# Patient Record
Sex: Male | Born: 1985 | Race: Black or African American | Hispanic: No | Marital: Single | Smoking: Never smoker
Health system: Southern US, Community
[De-identification: ages and names within clinical notes are randomized; demographics above are authoritative.]

---

## 2017-07-11 ENCOUNTER — Encounter (HOSPITAL_COMMUNITY): Payer: Self-pay | Admitting: Emergency Medicine

## 2017-07-11 ENCOUNTER — Ambulatory Visit (HOSPITAL_COMMUNITY)
Admission: RE | Admit: 2017-07-11 | Discharge: 2017-07-11 | Disposition: A | Discharge status: Home / Self Care | Payer: Commercial Managed Care - PPO | Source: Ambulatory Visit | Attending: Emergency Medicine | Admitting: Emergency Medicine

## 2017-07-11 ENCOUNTER — Other Ambulatory Visit: Payer: Self-pay

## 2017-07-11 ENCOUNTER — Ambulatory Visit (HOSPITAL_COMMUNITY)
Admission: EM | Admit: 2017-07-11 | Discharge: 2017-07-11 | Disposition: A | Discharge status: Home / Self Care | Payer: Self-pay | Attending: Internal Medicine | Admitting: Internal Medicine

## 2017-07-11 DIAGNOSIS — M79604 Pain in right leg: Secondary | ICD-10-CM | POA: Insufficient documentation

## 2017-07-11 DIAGNOSIS — M7989 Other specified soft tissue disorders: Secondary | ICD-10-CM

## 2017-07-11 DIAGNOSIS — M79609 Pain in unspecified limb: Secondary | ICD-10-CM

## 2017-07-11 DIAGNOSIS — L0390 Cellulitis, unspecified: Secondary | ICD-10-CM

## 2017-07-11 DIAGNOSIS — L03115 Cellulitis of right lower limb: Secondary | ICD-10-CM

## 2017-07-11 MED ORDER — CEPHALEXIN 500 MG PO CAPS: 500.0000 mg | ORAL_CAPSULE | Freq: Four times a day (QID) | ORAL | 0 refills | Status: AC

## 2017-07-11 NOTE — ED Notes (Signed)
US appointment at 4pm today. PT is to go to admissions desk at the front of the hospital

## 2017-07-11 NOTE — ED Provider Notes (Signed)
[facility]    CSN: [id number] Arrival date & time: 07/11/17  1319     History   Chief Complaint Chief Complaint  Patient presents with  . Leg Pain    HPI Daniel Charles is a 32 y.o. male.   Daniel Charles presents with complaints of right lower leg pain and swelling which has been present for approximately 1 week. Tender to touch. He states it is mildly improved currently. Denies any previous similar. He is a [employer] architecttruck driver. Pain is 3/10, pressure, worse when dependent. Left leg without swelling. Without cough, shortness of breath, or chest pain. Denies cardiac history, without fevers. Without blood clot history. Denies numbness or tingling to foot. Has not taken any medications for symptoms. Daniel Charles does vape.    ROS per HPI.       History reviewed. No pertinent past medical history.  There are no active problems to display for this patient.   History reviewed. No pertinent surgical history.     Home Medications    Prior to Admission medications   Medication Sig Start Date End Date Taking? Authorizing Provider  cephALEXin (KEFLEX) 500 MG capsule Take 1 capsule (500 mg total) by mouth 4 (four) times daily for 10 days. 07/11/17 07/21/17  Georgetta HaberBurky, Fotini Lemus B, NP    Family History No family history on file.  Social History Social History   Tobacco Use  . Smoking status: Never Smoker  Substance Use Topics  . Alcohol use: No    Frequency: Never  . Drug use: Not on file     Allergies   Patient has no known allergies.   Review of Systems Review of Systems   Physical Exam Triage Vital Signs ED Triage Vitals  Enc Vitals Group     BP 07/11/17 1403 (!) 172/91     Pulse Rate 07/11/17 1403 100     Resp 07/11/17 1403 18     Temp 07/11/17 1403 98.3 F (36.8 C)     Temp src --      SpO2 07/11/17 1403 97 %     Weight --      Height --      Head Circumference --      Peak Flow --      Pain Score 07/11/17 1404 3     Pain Loc --      Pain Edu? --    Excl. in GC? --    No data found.  Updated Vital Signs BP (!) 172/91   Pulse 100   Temp 98.3 F (36.8 C)   Resp 18   SpO2 97%   Visual Acuity Right Eye Distance:   Left Eye Distance:   Bilateral Distance:    Right Eye Near:   Left Eye Near:    Bilateral Near:     Physical Exam  Constitutional: He is oriented to person, place, and time. He appears well-developed and well-nourished.  Cardiovascular: Normal rate and regular rhythm.  Pulmonary/Chest: Effort normal and breath sounds normal.  Musculoskeletal:       Right lower leg: He exhibits tenderness, swelling and edema.       Legs: Redness, tenderness to distal right lower leg; sensation intact; pain primarily over anterior leg but redness noted to medial and posterior leg; strong radial pulse; complete ROM to ankle and toes; mildly warm to touch; without skin lesion   Neurological: He is alert and oriented to person, place, and time.  Skin: Skin is warm and dry.  UC Treatments / Results  Labs (all labs ordered are listed, but only abnormal results are displayed) Labs Reviewed - No data to display  EKG  EKG Interpretation None       Radiology No results found.  Procedures Procedures (including critical care time)  Medications Ordered in UC Medications - No data to display   Initial Impression / Assessment and Plan / UC Course  I have reviewed the triage vital signs and the nursing notes.  Pertinent labs & imaging results that were available during my care of the patient were reviewed by me and considered in my medical decision making (see chart for details).     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 Keflex started at this time presuming cellulitic in nature at this time. Will call patient if positive ultrasound- [phone]. Follow up for recheck in 3 days. Establish with a PCP for follow up and management as needed. Patient  verbalized understanding and agreeable to plan.  Return precautions provided.   Final Clinical Impressions(s) / UC Diagnoses   Final diagnoses:  Leg swelling  Cellulitis of right lower extremity    ED Discharge Orders        Ordered    cephALEXin (KEFLEX) 500 MG capsule  4 times daily     07/11/17 1501    VAS [place] LOWER EXTREMITY VENOUS (DVT)     07/11/17 1502        Controlled Substance Prescriptions [place] Controlled Substance Registry consulted? Not Applicable   Georgetta Haber, NP 07/11/17 1511

## 2017-07-11 NOTE — Progress Notes (Addendum)
Preliminary results by tech - Right Lower Ext. Venous Duplex Completed. Negative for deep vein thrombosis. [facility]. Marilynne Halstedita Aracelie Addis, BS, RDMS, RVT

## 2017-07-11 NOTE — ED Triage Notes (Signed)
Pt c/o R leg pain and swelling x1 week, denies injury. Pt is a Naval architecttruck driver.

## 2017-07-11 NOTE — Discharge Instructions (Signed)
We will rule out blood clot today with an ultrasound.  As long as this is negative we will treat this as cellulitis, or infection. Complete course of antibiotics. Elevate the leg for comfort, warm compresses may be helpful. Recheck in 3 days. Return sooner if worsening of pain, fevers, numbness or tingling or otherwise worsening of symptoms.  I will call you if any positive findings from ultrasound today.  Please establish with a primary care provider for follow up. Use of compression stockings to help prevent swelling, especially while working.

## 2017-07-13 ENCOUNTER — Ambulatory Visit (HOSPITAL_COMMUNITY)
Admission: EM | Admit: 2017-07-13 | Discharge: 2017-07-13 | Disposition: A | Discharge status: Home / Self Care | Payer: Commercial Managed Care - PPO | Attending: Family Medicine | Admitting: Family Medicine

## 2017-07-13 ENCOUNTER — Encounter (HOSPITAL_COMMUNITY): Payer: Self-pay | Admitting: Emergency Medicine

## 2017-07-13 ENCOUNTER — Other Ambulatory Visit: Payer: Self-pay

## 2017-07-13 DIAGNOSIS — M7989 Other specified soft tissue disorders: Secondary | ICD-10-CM | POA: Diagnosis not present

## 2017-07-13 DIAGNOSIS — Z09 Encounter for follow-up examination after completed treatment for conditions other than malignant neoplasm: Secondary | ICD-10-CM | POA: Diagnosis not present

## 2017-07-13 NOTE — ED Triage Notes (Signed)
Pt is here for recheck of his RLE.  Pt states that it is getting better.    Pt states he needs a note for work stating if this will happen again, why did it happen, and how can he prevent it.

## 2017-07-13 NOTE — ED Provider Notes (Signed)
[facility]    CSN: [id number] Arrival date & time: 07/13/17  1742     History   Chief Complaint Chief Complaint  Patient presents with  . Follow-up    HPI Daniel Charles is a 32 y.o. male presenting for follow up of right lower extremity soreness and swelling. He was seen here 2 days ago for this complaint. DVT ultrasound was performed and ruled out. Treated with keflex for possible cellulitis. Has taken Keflex for 1.5 days, feels area has improved, still slightly sore and tender. Elevating legs. Denies shortness of breath, chest pain. 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 Pain does not worsen with walking.  HPI  No past medical history on file.  There are no active problems to display for this patient.   No past surgical history on file.     Home Medications    Prior to Admission medications   Medication Sig Start Date End Date Taking? Authorizing Provider  cephALEXin (KEFLEX) 500 MG capsule Take 1 capsule (500 mg total) by mouth 4 (four) times daily for 10 days. 07/11/17 07/21/17  Georgetta HaberBurky, Natalie B, NP  cephALEXin (KEFLEX) 500 MG capsule Take 500 mg by mouth 4 (four) times daily. 07/11/17 07/21/17  [provider]    Family History No family history on file.  Social History Social History   Tobacco Use  . Smoking status: Never Smoker  Substance Use Topics  . Alcohol use: No    Frequency: Never  . Drug use: Not on file     Allergies   Patient has no known allergies.   Review of Systems Review of Systems  Constitutional: Negative for fever.  Respiratory: Negative for shortness of breath.   Cardiovascular: Negative for chest pain.  Gastrointestinal: Negative for abdominal pain, nausea and vomiting.  Musculoskeletal: Positive for myalgias. Negative for gait problem and joint swelling.       Leg swelling, leg pain  Skin: Positive for color change.  Neurological: Negative for dizziness,  light-headedness and headaches.     Physical Exam Triage Vital Signs ED Triage Vitals  Enc Vitals Group     BP 07/13/17 1817 (!) 155/104     Pulse Rate 07/13/17 1817 85     Resp --      Temp 07/13/17 1817 98.4 F (36.9 C)     Temp Source 07/13/17 1817 Oral     SpO2 07/13/17 1817 95 %     Weight --      Height --      Head Circumference --      Peak Flow --      Pain Score 07/13/17 1816 2     Pain Loc --      Pain Edu? --      Excl. in GC? --    No data found.  Updated Vital Signs BP (!) 155/104 (BP Location: Right Wrist)   Pulse 85   Temp 98.4 F (36.9 C) (Oral)   SpO2 95%    Physical Exam  Constitutional: He appears well-developed and well-nourished.  HENT:  Head: Normocephalic and atraumatic.  Eyes: Conjunctivae are normal.  Neck: Neck supple.  Cardiovascular: Normal rate and regular rhythm.  No murmur heard. Pulmonary/Chest: Effort normal and breath sounds normal. No respiratory distress. He has no wheezes.  Abdominal: He exhibits no distension.  Musculoskeletal: He exhibits edema.  Neurological: He is alert.  Skin: Skin is warm and dry.  Hyperpigmentation with minimal  erythema to right lower shin wrapping around to posterior leg. Skin changes not present on left lower leg. 1+ pitting edema on right, no edema on left. Mild tenderness to palpation on anterior right leg, no tenderness to calf or posterior.  Area appears to be slightly less hyperpigmented compared to 2 days ago. (compared to picture on mother's phone)  Psychiatric: He has a normal mood and affect.  Nursing note and vitals reviewed.    UC Treatments / Results  Labs (all labs ordered are listed, but only abnormal results are displayed) Labs Reviewed - No data to display  EKG  EKG Interpretation None       Radiology No results found.  Procedures Procedures (including critical care time)  Medications Ordered in UC Medications - No data to display   Initial Impression /  Assessment and Plan / UC Course  I have reviewed the triage vital signs and the nursing notes.  Pertinent labs & imaging results that were available during my care of the patient were reviewed by me and considered in my medical decision making (see chart for details).     DVT rule out [place] negative on Monday. Symptoms appear to be improving slightly after 1.5 days of treatment. Will continue Keflex. Advised compression stockings, elevation, walking frequently after sitting for extended periods of time. Discussed strict return precautions. Patient verbalized understanding and is agreeable with plan.   Final Clinical Impressions(s) / UC Diagnoses   Final diagnoses:  Right leg swelling  Follow up    ED Discharge Orders    None       Controlled Substance Prescriptions [place] Controlled Substance Registry consulted? Not Applicable   Lew Dawes, [place] 07/13/17 1859

## 2017-07-13 NOTE — Discharge Instructions (Signed)
Please continue coarse of Keflex and finish the entire 10 day course.   Please try compression stockings to help with circulation and swelling. Elevate legs at home when possible to help with swelling. When driving in car long distances, please may frequent stops to walk around and move legs.   Please continue to monitor for improvement as you continue to antibiotic course. Please return if swelling increases, redness increases, pain increases, develop shortness of breath, cough, difficulty breathing, lightheaded or dizziness.

## 2017-12-30 ENCOUNTER — Other Ambulatory Visit: Payer: Self-pay

## 2017-12-30 ENCOUNTER — Encounter (HOSPITAL_COMMUNITY): Payer: Self-pay | Admitting: Emergency Medicine

## 2017-12-30 ENCOUNTER — Ambulatory Visit (HOSPITAL_COMMUNITY)
Admission: EM | Admit: 2017-12-30 | Discharge: 2017-12-30 | Disposition: A | Discharge status: Home / Self Care | Payer: Self-pay | Attending: Family Medicine | Admitting: Family Medicine

## 2017-12-30 DIAGNOSIS — M5489 Other dorsalgia: Secondary | ICD-10-CM

## 2017-12-30 DIAGNOSIS — W19XXXA Unspecified fall, initial encounter: Secondary | ICD-10-CM

## 2017-12-30 DIAGNOSIS — M62830 Muscle spasm of back: Secondary | ICD-10-CM

## 2017-12-30 DIAGNOSIS — T148XXA Other injury of unspecified body region, initial encounter: Secondary | ICD-10-CM

## 2017-12-30 MED ORDER — KETOROLAC TROMETHAMINE 60 MG/2ML IM SOLN
60.0000 mg | Freq: Once | INTRAMUSCULAR | Status: AC
  Administered 2017-12-30: 60 mg via INTRAMUSCULAR

## 2017-12-30 MED ORDER — IBUPROFEN 800 MG PO TABS: 800.0000 mg | ORAL_TABLET | Freq: Three times a day (TID) | ORAL | 0 refills | Status: DC | PRN

## 2017-12-30 MED ORDER — METHYLPREDNISOLONE SODIUM SUCC 125 MG IJ SOLR
80.0000 mg | Freq: Once | INTRAMUSCULAR | Status: AC
  Administered 2017-12-30: 80 mg via INTRAMUSCULAR

## 2017-12-30 MED ORDER — KETOROLAC TROMETHAMINE 60 MG/2ML IM SOLN
INTRAMUSCULAR | Status: AC
  Filled 2017-12-30: qty 2

## 2017-12-30 MED ORDER — METHYLPREDNISOLONE SODIUM SUCC 125 MG IJ SOLR
INTRAMUSCULAR | Status: AC
  Filled 2017-12-30: qty 2

## 2017-12-30 MED ORDER — CYCLOBENZAPRINE HCL 5 MG PO TABS: 5.0000 mg | ORAL_TABLET | Freq: Three times a day (TID) | ORAL | 0 refills | Status: DC | PRN

## 2017-12-30 NOTE — Discharge Instructions (Signed)
Activity as tolerated Walking and gentle stretching is advisable Take ibuprofen 3 times a day with food Take the Flexeril as needed as a muscle relaxer Take a Flexeril before you go to bed tonight Return if you are not improving over the next week or 2

## 2017-12-30 NOTE — ED Triage Notes (Signed)
[facility] a complaint of left side flank and back pain that started after sleeping on a leaking air mattress 2 days ago.

## 2017-12-30 NOTE — ED Provider Notes (Signed)
[facility]    CSN: [id number] Arrival date & time: 12/30/17  1648     History   Chief Complaint Chief Complaint  Patient presents with  . Back Pain    HPI Daniel Charles is a 32 y.o. male.   HPI  Healthy 32 year old who is here with his mother for evaluation of back pain.  She drove because he is in too much pain to move comfortably.  This started day before yesterday, he slipped on air mattress that deflated over the night.  He woke up on the floor when he tried to move his left back was quite painful.  He stayed quite throughout the day.  Took some ibuprofen.  Today the back pain has not improved so he is here for evaluation. No history of significant back problem in the past. No bowel or bladder complaint.  No incontinence.  No urinary infection, hematuria, dysuria. The pain is in the mid left back and the flank area.  No radiation.  No numbness or weakness into the arms or legs.  He does say the pain increases with weight on the left leg.  Better with rest. He cannot tell that the over-the-counter medicine gave him significant pain relief. 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 History reviewed. No pertinent past medical history. Patient states that he has no ongoing medical problems. No prescription medications. There are no active problems to display for this patient.   History reviewed. No pertinent surgical history.     Home Medications    Prior to Admission medications   Medication Sig Start Date End Date Taking? Authorizing Provider  cyclobenzaprine (FLEXERIL) 5 MG tablet Take 1 tablet (5 mg total) by mouth 3 (three) times daily as needed for muscle spasms. 12/30/17   Eustace MooreNelson, Vimal Derego Sue, MD  ibuprofen (ADVIL,MOTRIN) 800 MG tablet Take 1 tablet (800 mg total) by mouth every 8 (eight) hours as needed for moderate pain. 12/30/17   Eustace MooreNelson, Nevae Pinnix Sue, MD    Family History History reviewed. No pertinent family history. Mother is  here.  Recovering from breast cancer.  Denies orthopedic complaints. Social History Social History   Tobacco Use  . Smoking status: Never Smoker  . Smokeless tobacco: Never Used  Substance Use Topics  . Alcohol use: No    Frequency: Never  . Drug use: Never     Allergies   Patient has no known allergies.   Review of Systems Review of Systems  Constitutional: Negative for chills and fever.  HENT: Negative for ear pain and sore throat.   Eyes: Negative for pain and visual disturbance.  Respiratory: Negative for cough and shortness of breath.   Cardiovascular: Negative for chest pain and palpitations.  Gastrointestinal: Negative for abdominal pain and vomiting.  Genitourinary: Negative for dysuria and hematuria.  Musculoskeletal: Positive for back pain. Negative for arthralgias.  Skin: Negative for color change and rash.  Neurological: Negative for seizures and syncope.  All other systems reviewed and are negative.    Physical Exam Triage Vital Signs ED Triage Vitals  Enc Vitals Group     BP 12/30/17 1747 (!) 159/93     Pulse Rate 12/30/17 1747 (!) 112     Resp 12/30/17 1747 20     Temp 12/30/17 1747 98.7 F (37.1 C)     Temp Source 12/30/17 1747 Oral     SpO2 12/30/17 1747 98 %     Weight --      Height --  Head Circumference --      Peak Flow --      Pain Score 12/30/17 1746 6     Pain Loc --      Pain Edu? --      Excl. in GC? --    No data found.  Updated Vital Signs BP (!) 159/93 (BP Location: Right Arm)   Pulse (!) 112   Temp 98.7 F (37.1 C) (Oral)   Resp 20   SpO2 98%       Physical Exam  Constitutional: He appears well-developed and well-nourished. He appears distressed.  Morbidly obese.  Appears acutely uncomfortable.  Very guarded movements.  HENT:  Head: Normocephalic and atraumatic.  Mouth/Throat: Oropharynx is clear and moist.  Eyes: Pupils are equal, round, and reactive to light. Conjunctivae are normal.  Neck: Normal range of  motion.  Cardiovascular: Normal rate, regular rhythm and normal heart sounds.  Pulmonary/Chest: Effort normal and breath sounds normal. No respiratory distress. He has no wheezes.  Abdominal: Soft. Bowel sounds are normal. He exhibits no distension.  Musculoskeletal: Normal range of motion. He exhibits no edema.  Patient has very limited range of motion secondary to pain.  He tends to lean towards the right.  No tenderness in the thoracic spine or ribs.  No tenderness in the lumbar spine.  No tenderness in the lumbar paraspinous muscles.  No tenderness over the SI joints or posterior pelvis.  Strength sensation range of motion and reflexes are normal in both lower extremities.  Neurological: He is alert.  Skin: Skin is warm and dry.  Psychiatric: He has a normal mood and affect. His behavior is normal.     UC Treatments / Results  Labs (all labs ordered are listed, but only abnormal results are displayed) Labs Reviewed - No data to display  EKG None  Radiology No results found.  Procedures Procedures (including critical care time)  Medications Ordered in UC Medications  ketorolac (TORADOL) injection 60 mg (60 mg Intramuscular Given 12/30/17 1857)  methylPREDNISolone sodium succinate (SOLU-MEDROL) 125 mg/2 mL injection 80 mg (80 mg Intramuscular Given 12/30/17 1857)    Initial Impression / Assessment and Plan / UC Course  I have reviewed the triage vital signs and the nursing notes.  Pertinent labs & imaging results that were available during my care of the patient were reviewed by me and considered in my medical decision making (see chart for details).     Patient was given an injection with Toradol.  Another injection of methylprednisolone.  After 30 to 40 minutes I rechecked him.  He was much improved.  Moving more comfortably.  Reexamined.  Straight leg raise is still negative bilaterally.  Lumbar mobility is slow and about 50% of normal.  Still no tenderness to  palpation. Final Clinical Impressions(s) / UC Diagnoses   Final diagnoses:  Muscle strain     Discharge Instructions     Activity as tolerated Walking and gentle stretching is advisable Take ibuprofen 3 times a day with food Take the Flexeril as needed as a muscle relaxer Take a Flexeril before you go to bed tonight Return if you are not improving over the next week or 2   ED Prescriptions    Medication Sig Dispense Auth. Provider   ibuprofen (ADVIL,MOTRIN) 800 MG tablet Take 1 tablet (800 mg total) by mouth every 8 (eight) hours as needed for moderate pain. 90 tablet Eustace Moore, MD   cyclobenzaprine (FLEXERIL) 5 MG tablet Take 1 tablet (5  mg total) by mouth 3 (three) times daily as needed for muscle spasms. 30 tablet Eustace Moore, MD     Controlled Substance Prescriptions [place] Controlled Substance Registry consulted? Not Applicable   Eustace Moore, MD 12/30/17 2203

## 2018-09-13 ENCOUNTER — Other Ambulatory Visit: Payer: Self-pay

## 2018-09-13 ENCOUNTER — Encounter (HOSPITAL_COMMUNITY): Payer: Self-pay | Admitting: Emergency Medicine

## 2018-09-13 ENCOUNTER — Emergency Department (HOSPITAL_COMMUNITY)
Admission: EM | Admit: 2018-09-13 | Discharge: 2018-09-13 | Disposition: A | Discharge status: Home / Self Care | Payer: Commercial Managed Care - PPO | Attending: Emergency Medicine | Admitting: Emergency Medicine

## 2018-09-13 DIAGNOSIS — Y929 Unspecified place or not applicable: Secondary | ICD-10-CM | POA: Diagnosis not present

## 2018-09-13 DIAGNOSIS — M545 Low back pain, unspecified: Secondary | ICD-10-CM

## 2018-09-13 DIAGNOSIS — Y33XXXA Other specified events, undetermined intent, initial encounter: Secondary | ICD-10-CM | POA: Insufficient documentation

## 2018-09-13 DIAGNOSIS — Y998 Other external cause status: Secondary | ICD-10-CM | POA: Insufficient documentation

## 2018-09-13 DIAGNOSIS — Y9389 Activity, other specified: Secondary | ICD-10-CM | POA: Insufficient documentation

## 2018-09-13 DIAGNOSIS — S39012A Strain of muscle, fascia and tendon of lower back, initial encounter: Secondary | ICD-10-CM | POA: Diagnosis not present

## 2018-09-13 DIAGNOSIS — S3992XA Unspecified injury of lower back, initial encounter: Secondary | ICD-10-CM | POA: Diagnosis present

## 2018-09-13 MED ORDER — LIDOCAINE 5 % EX PTCH
1.0000 | MEDICATED_PATCH | CUTANEOUS | Status: DC
  Administered 2018-09-13: 1 via TRANSDERMAL
  Filled 2018-09-13: qty 1

## 2018-09-13 MED ORDER — METHOCARBAMOL 500 MG PO TABS: 500.0000 mg | ORAL_TABLET | Freq: Three times a day (TID) | ORAL | 0 refills | Status: DC

## 2018-09-13 MED ORDER — KETOROLAC TROMETHAMINE 15 MG/ML IJ SOLN
15.0000 mg | Freq: Once | INTRAMUSCULAR | Status: AC
  Administered 2018-09-13: 15 mg via INTRAMUSCULAR
  Filled 2018-09-13: qty 1

## 2018-09-13 NOTE — ED Notes (Signed)
Patient verbalizes understanding of discharge instructions. Opportunity for questioning and answers were provided. Armband removed by staff, pt discharged from ED ambulatory to home.  

## 2018-09-13 NOTE — ED Triage Notes (Signed)
Pt complains of right sided back and side pain. Pt thinks he has a pinched nerve

## 2018-09-13 NOTE — Discharge Instructions (Addendum)
You have been diagnosed today with right-sided lower back pain.  At this time there does not appear to be the presence of an emergent medical condition, however there is always the potential for conditions to change. Please read and follow the below instructions.  Please return to the Emergency Department immediately for any new or worsening symptoms. Please be sure to follow up with your Primary Care Provider within one week regarding your visit today; please call their office to schedule an appointment even if you are feeling better for a follow-up visit.  If you do not have a primary care provider you may contact [place] [facility] to establish one. You have been given an anti-inflammatory shot today called Toradol.  Please avoid further NSAIDs including ibuprofen/Motrin over the next 2 days.  Please drink plenty of water. You may use the the muscle relaxer Robaxin as prescribed to help with your symptoms.  Do not drive or operate machinery while taking a muscle relaxers as they may make you sleepy.  Get help right away if: You develop new bowel or bladder control problems. You have unusual weakness or numbness in your arms or legs. You develop nausea or vomiting. You develop abdominal pain. You feel faint. You have fever or chills Any new or concerning symptoms  Please read the additional information packets attached to your discharge summary.  Do not take your medicine if  develop an itchy rash, swelling in your mouth or lips, or difficulty breathing.

## 2018-09-13 NOTE — ED Provider Notes (Signed)
[name] EMERGENCY DEPARTMENT Provider Note   CSN: [id number] Arrival date & time: 09/13/18  1605    History   Chief Complaint Chief Complaint  Patient presents with  . Back Pain    HPI Daniel Charles is a 33 y.o. male presenting today for right-sided lower back pain.  Reports a 3-day history of right-sided lower back pain, sharp throbbing constant worsened with movement and improved with rest and moderate in intensity.  Patient believes that his pain started after sleeping on an uncomfortable mattress 3 nights ago.  Patient reports that he has had similar pain in the past after again sleeping on an air mattress, he states that he was seen at an urgent care in July of last year at that time he was provided with a Toradol shot as well as steroid shot which alleviated his pain.  Patient reports that his pain today is similar in nature to July of last year however today's pain is less severe.  Patient denies fever/chills, history of IV drug use, history of cancer, saddle area paresthesias, bowel/bladder incontinence, urinary retention, numbness/weakness or tingling of the extremities or any additional concerns today.    HPI  History reviewed. No pertinent past medical history.  There are no active problems to display for this patient.   History reviewed. No pertinent surgical history.      Home Medications    Prior to Admission medications   Medication Sig Start Date End Date Taking? Authorizing Provider  naproxen sodium (ALEVE) 220 MG tablet Take 440 mg by mouth as needed (pain).   Yes [provider]  methocarbamol (ROBAXIN) 500 MG tablet Take 1 tablet (500 mg total) by mouth 3 (three) times daily. 09/13/18   Bill Salinas, PA-C    Family History No family history on file.  Social History Social History   Tobacco Use  . Smoking status: Never Smoker  . Smokeless tobacco: Never Used  Substance Use Topics  . Alcohol use: No   Frequency: Never  . Drug use: Never     Allergies   Patient has no known allergies.   Review of Systems Review of Systems  Constitutional: Negative.  Negative for chills and fever.  Cardiovascular: Negative.  Negative for chest pain.  Gastrointestinal: Negative.  Negative for abdominal pain, nausea and vomiting.  Genitourinary: Negative.  Negative for dysuria, flank pain, hematuria, scrotal swelling and testicular pain.  Musculoskeletal: Positive for back pain. Negative for neck pain.  Neurological: Negative for weakness, numbness and headaches.       Denies saddle area paresthesias Denies bowel or bladder incontinence Denies urinary retention   Physical Exam Updated Vital Signs BP (!) 163/92 (BP Location: Right Arm)   Pulse (!) 108   Temp 98.3 F (36.8 C) (Oral)   Resp 16   Ht 6\' 2"  (1.88 m)   Wt (!) 185.5 kg   SpO2 97%   BMI 52.51 kg/m   Physical Exam Constitutional:      General: He is not in acute distress.    Appearance: Normal appearance. He is obese. He is not ill-appearing or diaphoretic.  HENT:     Head: Normocephalic and atraumatic. No raccoon eyes or Battle's sign.     Jaw: There is normal jaw occlusion. No trismus.     Right Ear: Tympanic membrane, ear canal and external ear normal. No hemotympanum.     Left Ear: Tympanic membrane, ear canal and external ear normal. No hemotympanum.     Nose:  Nose normal.     Mouth/Throat:     Lips: Pink.     Mouth: Mucous membranes are moist.     Pharynx: Oropharynx is clear. Uvula midline.  Eyes:     General: Vision grossly intact. Gaze aligned appropriately.     Extraocular Movements: Extraocular movements intact.     Conjunctiva/sclera: Conjunctivae normal.     Pupils: Pupils are equal, round, and reactive to light.  Neck:     Musculoskeletal: Normal range of motion and neck supple. No neck rigidity.     Trachea: Trachea and phonation normal. No tracheal tenderness or tracheal deviation.  Cardiovascular:      Rate and Rhythm: Normal rate and regular rhythm.     Pulses:          Radial pulses are 2+ on the right side and 2+ on the left side.       Dorsalis pedis pulses are 2+ on the right side and 2+ on the left side.       Posterior tibial pulses are 2+ on the right side and 2+ on the left side.     Heart sounds: Normal heart sounds.  Pulmonary:     Effort: Pulmonary effort is normal. No respiratory distress.     Breath sounds: Normal breath sounds and air entry. No decreased breath sounds or rhonchi.  Abdominal:     General: Bowel sounds are normal. There is no distension.     Palpations: Abdomen is soft. There is no pulsatile mass.     Tenderness: There is no abdominal tenderness. There is no right CVA tenderness, left CVA tenderness, guarding or rebound.  Musculoskeletal:       Back:     Comments: No midline C/T/L spinal tenderness to palpation no deformity, crepitus, or step-off noted. No sign of injury to the neck or back.  Tenderness and pain with movement of the right lower back as indicated.   Feet:     Right foot:     Protective Sensation: 3 sites tested. 3 sites sensed.     Left foot:     Protective Sensation: 3 sites tested. 3 sites sensed.  Skin:    General: Skin is warm and dry.     Capillary Refill: Capillary refill takes less than 2 seconds.  Neurological:     Mental Status: He is alert.     GCS: GCS eye subscore is 4. GCS verbal subscore is 5. GCS motor subscore is 6.     Comments: Speech is clear and goal oriented, follows commands Major Cranial nerves without deficit, no facial droop Normal strength in upper and lower extremities bilaterally including dorsiflexion and plantar flexion, strong and equal grip strength Sensation normal to light touch DTR 2+ bilateral patella, no clonus of the feet Moves extremities without ataxia, coordination intact Normal gait  Psychiatric:        Behavior: Behavior is cooperative.      ED Treatments / Results  Labs (all labs  ordered are listed, but only abnormal results are displayed) Labs Reviewed - No data to display  EKG None  Radiology No results found.  Procedures Procedures (including critical care time)  Medications Ordered in ED Medications  lidocaine (LIDODERM) 5 % 1 patch (1 patch Transdermal Patch Applied 09/13/18 1749)  ketorolac (TORADOL) 15 MG/ML injection 15 mg (15 mg Intramuscular Given 09/13/18 1749)     Initial Impression / Assessment and Plan / ED Course  I have reviewed the triage vital signs and  the nursing notes.  Pertinent labs & imaging results that were available during my care of the patient were reviewed by me and considered in my medical decision making (see chart for details).    Derico Mitton is a 33 y.o. male presenting with right-sided lower back pain.  He describes this as his typical back pain however less severe than previous presentations. Patient denies history of trauma, fever, IV drug use, night sweats, weight loss, cancer, saddle anesthesia, urinary rentention, bowel/bladder incontinence. No neurological deficits and normal neuro exam.  He denies dysuria/hematuria and denies personal/family history of kidney stones.  Suspect musculoskeletal etiology of patient's pain. Pain is consistently reproducible with movement and palpation right lower back musculature. Abdomen soft/nontender and without pulsatile mass. Patient with equal pedal pulses. Doubt spinal epidural abscess, nephrolithiasis, cauda equina or AAA.  Imaging not indicated at this time.  Patient is ambulatory in the emergency department without assistance. RICE protocol and pain medicine indicated and discussed with patient.   Toradol 15 mg IM given.  Patient denies history of CKD or gastric ulcers/bleeding. Robaxin  TID prescribed. Patient informed to avoid driving or operating heavy machinery while taking muscle relaxer.  Additionally patient informed that he was hypertensive today.   Encouraged to follow-up with primary care provider regarding this.  He is asymptomatic regarding his hypertension.  Patient informed of signs and symptoms of hypertensive emergency/urgency and to return to the emergency department if these occur.  At this time there does not appear to be any evidence of an acute emergency medical condition and the patient appears stable for discharge with appropriate outpatient follow up. Diagnosis was discussed with patient who verbalizes understanding of care plan and is agreeable to discharge. I have discussed return precautions with patient who verbalize understanding of return precautions. Patient encouraged to follow-up with their PCP. All questions answered.  Patient has been discharged in good condition.   Note: Portions of this report may have been transcribed using voice recognition software. Every effort was made to ensure accuracy; however, inadvertent computerized transcription errors may still be present. Final Clinical Impressions(s) / ED Diagnoses   Final diagnoses:  Acute right-sided low back pain without sciatica  Strain of lumbar region, initial encounter    ED Discharge Orders         Ordered    methocarbamol (ROBAXIN) 500 MG tablet  3 times daily     09/13/18 1825           Elizabeth Palau 09/14/18 2127    Terrilee Files, MD 09/15/18 [phone]

## 2020-07-28 ENCOUNTER — Emergency Department (HOSPITAL_COMMUNITY): Payer: Commercial Managed Care - PPO

## 2020-07-28 ENCOUNTER — Inpatient Hospital Stay (HOSPITAL_COMMUNITY)
Admission: EM | Admit: 2020-07-28 | Discharge: 2020-08-01 | DRG: 177 | Disposition: A | Discharge status: Home Health | Payer: Commercial Managed Care - PPO | Attending: Internal Medicine | Admitting: Internal Medicine

## 2020-07-28 ENCOUNTER — Encounter (HOSPITAL_COMMUNITY): Payer: Self-pay | Admitting: Emergency Medicine

## 2020-07-28 ENCOUNTER — Other Ambulatory Visit: Payer: Self-pay

## 2020-07-28 ENCOUNTER — Emergency Department (HOSPITAL_BASED_OUTPATIENT_CLINIC_OR_DEPARTMENT_OTHER): Payer: Commercial Managed Care - PPO

## 2020-07-28 DIAGNOSIS — Z713 Dietary counseling and surveillance: Secondary | ICD-10-CM

## 2020-07-28 DIAGNOSIS — I2694 Multiple subsegmental pulmonary emboli without acute cor pulmonale: Secondary | ICD-10-CM | POA: Diagnosis present

## 2020-07-28 DIAGNOSIS — M7989 Other specified soft tissue disorders: Secondary | ICD-10-CM

## 2020-07-28 DIAGNOSIS — Z79899 Other long term (current) drug therapy: Secondary | ICD-10-CM

## 2020-07-28 DIAGNOSIS — J9601 Acute respiratory failure with hypoxia: Secondary | ICD-10-CM | POA: Diagnosis present

## 2020-07-28 DIAGNOSIS — M79604 Pain in right leg: Secondary | ICD-10-CM | POA: Diagnosis present

## 2020-07-28 DIAGNOSIS — U071 COVID-19: Secondary | ICD-10-CM | POA: Diagnosis not present

## 2020-07-28 DIAGNOSIS — I2699 Other pulmonary embolism without acute cor pulmonale: Secondary | ICD-10-CM | POA: Diagnosis not present

## 2020-07-28 DIAGNOSIS — R6510 Systemic inflammatory response syndrome (SIRS) of non-infectious origin without acute organ dysfunction: Secondary | ICD-10-CM | POA: Diagnosis present

## 2020-07-28 DIAGNOSIS — R609 Edema, unspecified: Secondary | ICD-10-CM | POA: Diagnosis not present

## 2020-07-28 DIAGNOSIS — Z6841 Body Mass Index (BMI) 40.0 and over, adult: Secondary | ICD-10-CM

## 2020-07-28 DIAGNOSIS — D6859 Other primary thrombophilia: Secondary | ICD-10-CM | POA: Diagnosis present

## 2020-07-28 DIAGNOSIS — J1282 Pneumonia due to coronavirus disease 2019: Secondary | ICD-10-CM | POA: Diagnosis present

## 2020-07-28 LAB — URINALYSIS, ROUTINE W REFLEX MICROSCOPIC
Glucose, UA: NEGATIVE mg/dL
Hgb urine dipstick: NEGATIVE
Ketones, ur: 5 mg/dL — AB
Nitrite: NEGATIVE
Protein, ur: 30 mg/dL — AB
Specific Gravity, Urine: 1.038 — ABNORMAL HIGH (ref 1.005–1.030)
pH: 6 (ref 5.0–8.0)

## 2020-07-28 LAB — TROPONIN I (HIGH SENSITIVITY)
Troponin I (High Sensitivity): 43 ng/L — ABNORMAL HIGH (ref <18)
Troponin I (High Sensitivity): 8 ng/L (ref <18)

## 2020-07-28 LAB — BASIC METABOLIC PANEL
Anion gap: 11 (ref 5–15)
BUN: 7 mg/dL (ref 6–20)
CO2: 25 mmol/L (ref 22–32)
Calcium: 9.1 mg/dL (ref 8.9–10.3)
Chloride: 102 mmol/L (ref 98–111)
Creatinine, Ser: 1.22 mg/dL (ref 0.61–1.24)
GFR, Estimated: >60 mL/min (ref >60)
Glucose, Bld: 116 mg/dL — ABNORMAL HIGH (ref 70–99)
Potassium: 4.1 mmol/L (ref 3.5–5.1)
Sodium: 138 mmol/L (ref 135–145)

## 2020-07-28 LAB — CBC
HCT: 45.7 % (ref 39.0–52.0)
Hemoglobin: 14 g/dL (ref 13.0–17.0)
MCH: 21.9 pg — ABNORMAL LOW (ref 26.0–34.0)
MCHC: 30.6 g/dL (ref 30.0–36.0)
MCV: 71.6 fL — ABNORMAL LOW (ref 80.0–100.0)
Platelets: 301 10*3/uL (ref 150–400)
RBC: 6.38 MIL/uL — ABNORMAL HIGH (ref 4.22–5.81)
RDW: 15.8 % — ABNORMAL HIGH (ref 11.5–15.5)
WBC: 18.3 10*3/uL — ABNORMAL HIGH (ref 4.0–10.5)
nRBC: 0 % (ref 0.0–0.2)

## 2020-07-28 LAB — SARS CORONAVIRUS 2 BY RT PCR (HOSPITAL ORDER, PERFORMED IN ~~LOC~~ HOSPITAL LAB): SARS Coronavirus 2: POSITIVE — AB

## 2020-07-28 LAB — HIV ANTIBODY (ROUTINE TESTING W REFLEX): HIV Screen 4th Generation wRfx: NONREACTIVE

## 2020-07-28 MED ORDER — HEPARIN BOLUS VIA INFUSION
7000.0000 [IU] | Freq: Once | INTRAVENOUS | Status: AC
  Administered 2020-07-28: 7000 [IU] via INTRAVENOUS
  Filled 2020-07-28: qty 7000

## 2020-07-28 MED ORDER — MORPHINE SULFATE (PF) 4 MG/ML IV SOLN
4.0000 mg | Freq: Once | INTRAVENOUS | Status: AC
  Administered 2020-07-28: 4 mg via INTRAVENOUS
  Filled 2020-07-28: qty 1

## 2020-07-28 MED ORDER — HEPARIN (PORCINE) 25000 UT/250ML-% IV SOLN
2000.0000 [IU]/h | INTRAVENOUS | Status: DC
  Administered 2020-07-28 – 2020-07-29 (×3): 2000 [IU]/h via INTRAVENOUS
  Filled 2020-07-28 (×3): qty 250

## 2020-07-28 MED ORDER — POLYETHYLENE GLYCOL 3350 17 G PO PACK: 17.0000 g | PACK | Freq: Every day | ORAL | Status: DC | PRN

## 2020-07-28 MED ORDER — SODIUM CHLORIDE 0.9% FLUSH
3.0000 mL | Freq: Two times a day (BID) | INTRAVENOUS | Status: DC
  Administered 2020-07-29 – 2020-08-01 (×7): 3 mL via INTRAVENOUS

## 2020-07-28 MED ORDER — ACETAMINOPHEN 650 MG RE SUPP: 650.0000 mg | Freq: Four times a day (QID) | RECTAL | Status: DC | PRN

## 2020-07-28 MED ORDER — LACTATED RINGERS IV BOLUS
1000.0000 mL | Freq: Once | INTRAVENOUS | Status: AC
  Administered 2020-07-28: 1000 mL via INTRAVENOUS

## 2020-07-28 MED ORDER — ACETAMINOPHEN 325 MG PO TABS
650.0000 mg | ORAL_TABLET | Freq: Four times a day (QID) | ORAL | Status: DC | PRN
  Administered 2020-07-29 (×2): 650 mg via ORAL
  Filled 2020-07-28 (×3): qty 2

## 2020-07-28 MED ORDER — OXYCODONE-ACETAMINOPHEN 5-325 MG PO TABS
1.0000 | ORAL_TABLET | ORAL | Status: DC | PRN
  Administered 2020-07-29 (×2): 1 via ORAL
  Filled 2020-07-28 (×2): qty 1

## 2020-07-28 MED ORDER — ACETAMINOPHEN 325 MG PO TABS
650.0000 mg | ORAL_TABLET | Freq: Once | ORAL | Status: AC
  Administered 2020-07-28: 650 mg via ORAL
  Filled 2020-07-28: qty 2

## 2020-07-28 MED ORDER — MORPHINE SULFATE (PF) 2 MG/ML IV SOLN: 2.0000 mg | INTRAVENOUS | Status: DC | PRN

## 2020-07-28 MED ORDER — IOHEXOL 350 MG/ML SOLN
60.0000 mL | Freq: Once | INTRAVENOUS | Status: AC | PRN
  Administered 2020-07-28: 60 mL via INTRAVENOUS

## 2020-07-28 MED ORDER — SODIUM CHLORIDE 0.9 % IV SOLN: INTRAVENOUS | Status: AC

## 2020-07-28 NOTE — ED Notes (Addendum)
Pt drinking water. Unable to get pt's oral temp. Pt's axillary temp 102.1. Informed Josh - RN.

## 2020-07-28 NOTE — Progress Notes (Signed)
Patient arrived to 4E09 from ED. Placed on monitor, vitals taken, CHG bath given. Pt has heart rate in 120's. MEWS score is yellow due to increased heart rate. Implemented Yellow MEWS protocol. Per progress notes MD is already aware of increased heart rate.

## 2020-07-28 NOTE — ED Notes (Signed)
Patient transported to CT 

## 2020-07-28 NOTE — Progress Notes (Signed)
ANTICOAGULATION CONSULT NOTE - Initial Consult  Pharmacy Consult for heparin Indication: pulmonary embolus  No Known Allergies  Patient Measurements: Height: 6\' 2"  (188 cm) Weight: (!) 181.4 kg (400 lb) IBW/kg (Calculated) : 82.2 Heparin Dosing Weight: 126kg  Vital Signs: Temp: 101.1 F (38.4 C) (01/31 1739) Temp Source: Oral (01/31 1739) BP: 140/78 (01/31 1739) Pulse Rate: 125 (01/31 1739)  Labs: Recent Labs    07/28/20 1134 07/28/20 1803  HGB 14.0  --   HCT 45.7  --   PLT 301  --   CREATININE 1.22  --   TROPONINIHS  --  43*    Estimated Creatinine Clearance: 145.7 mL/min (by C-G formula based on SCr of 1.22 mg/dL).   Medical History: History reviewed. No pertinent past medical history.   Assessment: 34 YOM presenting with RLL pain, SOB, with bilateral PE on CT angio, doppler negative. He is not on anticoagulation PTA, CBC wnl.     Goal of Therapy:  Heparin level 0.3-0.7 units/ml Monitor platelets by anticoagulation protocol: Yes   Plan:  Heparin 7000 units IV x 1, and gtt at 2000 units/hr F/u 6 hour heparin level  07/30/20, PharmD Clinical Pharmacist ED Pharmacist Phone # [phone] 07/28/2020 7:27 PM

## 2020-07-28 NOTE — Progress Notes (Addendum)
Update  > Patient tested positive for COVID-19 this likely increased his risk for subsequent DVT and PE.  Also provides alternative explanation for his fever. > Still not requiring any oxygen - Continue current therapy

## 2020-07-28 NOTE — H&P (Signed)
History and Physical   Daniel Charles XIH:[id number] DOB: [birth year] DOA: 07/28/2020  PCP: Patient, No Pcp Per   Patient coming from: Home  Chief Complaint: Leg pain, shortness of breath  HPI: Daniel Charles is a 35 y.o. male with no known past medical history who presents with ongoing right leg pain and new chest pain shortness of breath.  Patient states that 6 days ago he noticed a sharp shooting pain in his right calf which has been worsening.  He denies any mechanical injury and states that the pain is worse with movement.  Today, he noticed that the pain seemed to move to his chest and became constant in his chest and had some associated shortness of breath.  The chest pain is located on his right side and anterior chest on the right.  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 He denies family history of blood clots. He denies cough, abdominal pain, nausea, constipation, diarrhea.  ED Course: Vital signs in the ED significant for blood pressure stable in the 120s to 140 systolic, heart rate in the 110s to 120s, respiratory rate in the teens to 20s, saturating well on room air at this time, febrile to 100-1 01.  Lab work-up showed BMP with glucose 116.  CBC showed leukocytosis to 18.3.  Troponin, respiratory panel for Covid, urinalysis are pending.  Imaging showed chest x-ray with patchy atelectasis in the bases, a right tib-fib x-ray showed edema without osseous abnormality.  CTA of the chest showed PE with moderate burden, RV to LV ratio of 1, right lower lobe consolidation with characteristics suspicious for infarction.  Patient started on heparin drip in the ED.  Review of Systems: As per HPI otherwise all other systems reviewed and are negative.  History reviewed. No pertinent past medical history.  History reviewed. No pertinent surgical history.  Social History  reports that he has never smoked. He has never used smokeless tobacco. He reports  that he does not drink alcohol and does not use drugs.  No Known Allergies  Family History  Problem Relation Age of Onset  . Hypertension Mother   . Gout Father   . Deep vein thrombosis Neg Hx   Reviewed on admission  Prior to Admission medications   Medication Sig Start Date End Date Taking? Authorizing Provider  methocarbamol (ROBAXIN) 500 MG tablet Take 1 tablet (500 mg total) by mouth 3 (three) times daily. 09/13/18   Harlene Salts A, PA-C  naproxen sodium (ALEVE) 220 MG tablet Take 440 mg by mouth as needed (pain).    [provider]    Physical Exam: Vitals:   07/28/20 1445 07/28/20 1600 07/28/20 1708 07/28/20 1739  BP: (!) 133/91 (!) 141/80 125/67 140/78  Pulse: (!) 113 (!) 112 (!) 122 (!) 125  Resp: (!) 26 11 (!) 25 (!) 28  Temp:   (!) 102.1 F (38.9 C) (!) 101.1 F (38.4 C)  TempSrc:   Axillary Oral  SpO2: 98% 97% 96% 97%   Physical Exam Constitutional:      General: He is not in acute distress.    Appearance: He is obese.     Comments: Mildly uncomfortable appearing  HENT:     Head: Normocephalic and atraumatic.     Mouth/Throat:     Mouth: Mucous membranes are moist.     Pharynx: Oropharynx is clear.  Eyes:     Extraocular Movements: Extraocular movements intact.     Pupils: Pupils are  equal, round, and reactive to light.  Cardiovascular:     Rate and Rhythm: Regular rhythm. Tachycardia present.     Pulses: Normal pulses.     Heart sounds: Normal heart sounds.  Pulmonary:     Effort: Pulmonary effort is normal. No respiratory distress.     Breath sounds: Normal breath sounds.     Comments: tachypnic Abdominal:     General: Bowel sounds are normal. There is no distension.     Palpations: Abdomen is soft.     Tenderness: There is no abdominal tenderness.  Musculoskeletal:        General: No swelling or deformity.  Skin:    General: Skin is warm and dry.  Neurological:     General: No focal deficit present.     Mental Status: Mental  status is at baseline.    Labs on Admission: I have personally reviewed following labs and imaging studies  CBC: Recent Labs  Lab 07/28/20 1134  WBC 18.3*  HGB 14.0  HCT 45.7  MCV 71.6*  PLT 301    Basic Metabolic Panel: Recent Labs  Lab 07/28/20 1134  NA 138  K 4.1  CL 102  CO2 25  GLUCOSE 116*  BUN 7  CREATININE 1.22  CALCIUM 9.1    GFR: CrCl cannot be calculated (Unknown ideal weight.).  Liver Function Tests: No results for input(s): AST, ALT, ALKPHOS, BILITOT, PROT, ALBUMIN in the last 168 hours.  Urine analysis:    Component Value Date/Time   COLORURINE AMBER (A) 07/28/2020 1803   APPEARANCEUR CLEAR 07/28/2020 1803   LABSPEC 1.038 (H) 07/28/2020 1803   PHURINE 6.0 07/28/2020 1803   GLUCOSEU NEGATIVE 07/28/2020 1803   HGBUR NEGATIVE 07/28/2020 1803   BILIRUBINUR SMALL (A) 07/28/2020 1803   KETONESUR 5 (A) 07/28/2020 1803   PROTEINUR 30 (A) 07/28/2020 1803   NITRITE NEGATIVE 07/28/2020 1803   LEUKOCYTESUR TRACE (A) 07/28/2020 1803    Radiological Exams on Admission: DG Chest 2 View  Result Date: 07/28/2020 CLINICAL DATA:  Shortness of breath EXAM: CHEST - 2 VIEW COMPARISON:  None. FINDINGS: Low lung volumes. Mild patchy density at the lung bases. No significant pleural effusion. No pneumothorax. Cardiomediastinal contours are within normal limits. IMPRESSION: Mild patchy atelectasis/consolidation at the lung bases. Electronically Signed   By: Guadlupe SpanishPraneil  Patel M.D.   On: 07/28/2020 12:17   DG Tibia/Fibula Right  Result Date: 07/28/2020 CLINICAL DATA:  posterior tib pain Right leg pain no known injury.  Swelling and bruising. EXAM: RIGHT TIBIA AND FIBULA - 2 VIEW COMPARISON:  None. FINDINGS: The cortical margins of the tibia and fibula are intact. There is no evidence of fracture or other focal bone lesions. Knee and ankle alignment are maintained. Diffuse soft tissue edema throughout the lower leg. No soft tissue air. IMPRESSION: Diffuse soft tissue edema  throughout the lower leg. No osseous abnormality. Electronically Signed   By: Narda RutherfordMelanie  Sanford M.D.   On: 07/28/2020 15:55   CT Angio Chest PE W and/or Wo Contrast  Result Date: 07/28/2020 CLINICAL DATA:  PE suspected, high prob Leg pain.  Right flank pain. EXAM: CT ANGIOGRAPHY CHEST WITH CONTRAST TECHNIQUE: Multidetector CT imaging of the chest was performed using the standard protocol during bolus administration of intravenous contrast. Multiplanar CT image reconstructions and MIPs were obtained to evaluate the vascular anatomy. CONTRAST:  60mL OMNIPAQUE IOHEXOL 350 MG/ML SOLN COMPARISON:  Chest radiograph earlier today. FINDINGS: Cardiovascular: Examination is positive for acute bilateral pulmonary emboli. Evaluation is technically limited due  to soft tissue attenuation from habitus and contrast bolus timing. Filling defects are seen on the right involving the distal main pulmonary artery extending into the upper, middle, and lower lobar pulmonary arteries, with segmental and subsegmental involvement of the lower lobe. Filling defects on the left involve the left lower lobar, upper and lower segmental and scattered subsegmental pulmonary arteries. Thromboembolic burden is moderate. RV to LV ratio is borderline at 1. The heart is normal in size. No aortic dissection. Conventional branching pattern from the aortic arch. No pericardial effusion. Mediastinum/Nodes: Enlarged low right hilar node measuring 19 mm. Left lower paratracheal node measures 12 mm, series 5, image 36. Additional smaller mediastinal lymph nodes that are prominent but not enlarged by size criteria. No visualized thyroid nodule. Decompressed esophagus. Lungs/Pleura: Consolidation in the right lower lobe abuts the pleura, has surrounding ground-glass opacity and is suspicious for pulmonary infarct. There is linear atelectasis in the left lower lobe and right middle lobe. Minimal right pleural thickening/trace effusion. The trachea and central  bronchi are patent. Upper Abdomen: Enlarged liver with suspected steatosis, partially included. Musculoskeletal: There are no acute or suspicious osseous abnormalities. Review of the MIP images confirms the above findings. IMPRESSION: 1. Examination is positive for acute bilateral pulmonary emboli. Thromboembolic burden is moderate. Borderline RV to LV ratio at 1. 2. Right lower lobe consolidation with surrounding ground-glass opacity, suspicious for pulmonary infarct. 3. Enlarged low right hilar and mediastinal lymph nodes are likely reactive. 4. Enlarged liver with suspected steatosis, partially included. Critical Value/emergent results were called by telephone at the time of interpretation on 07/28/2020 at 5:47 pm to provider Messner , who verbally acknowledged these results. Electronically Signed   By: Narda Rutherford M.D.   On: 07/28/2020 17:56   VAS [place] LOWER EXTREMITY VENOUS (DVT) (ONLY [facility] & [facility])  Result Date: 07/28/2020  Lower Venous DVT Study Indications: Pain, Swelling, and Edema.  Limitations: Poor ultrasound/tissue interface and body habitus. Comparison Study: 07/11/17 previous Performing Technologist: Blanch Media RVS  Examination Guidelines: A complete evaluation includes B-mode imaging, spectral Doppler, color Doppler, and power Doppler as needed of all accessible portions of each vessel. Bilateral testing is considered an integral part of a complete examination. Limited examinations for reoccurring indications may be performed as noted. The reflux portion of the exam is performed with the patient in reverse Trendelenburg.  +---------+---------------+---------+-----------+----------+-------------------+ RIGHT    CompressibilityPhasicitySpontaneityPropertiesThrombus Aging      +---------+---------------+---------+-----------+----------+-------------------+ CFV      Full           Yes      Yes                                       +---------+---------------+---------+-----------+----------+-------------------+ SFJ      Full                                                             +---------+---------------+---------+-----------+----------+-------------------+ FV Prox  Full                                                             +---------+---------------+---------+-----------+----------+-------------------+  FV Mid   Full           Yes      Yes                                      +---------+---------------+---------+-----------+----------+-------------------+ FV DistalFull           Yes      Yes                                      +---------+---------------+---------+-----------+----------+-------------------+ PFV      Full                                                             +---------+---------------+---------+-----------+----------+-------------------+ POP      Full           Yes      Yes                                      +---------+---------------+---------+-----------+----------+-------------------+ PTV      Full                                                             +---------+---------------+---------+-----------+----------+-------------------+ PERO                                                  Not well visualized +---------+---------------+---------+-----------+----------+-------------------+   +----+---------------+---------+-----------+----------+--------------+ LEFTCompressibilityPhasicitySpontaneityPropertiesThrombus Aging +----+---------------+---------+-----------+----------+--------------+ CFV Full           Yes      Yes                                 +----+---------------+---------+-----------+----------+--------------+     Summary: RIGHT: - There is no evidence of deep vein thrombosis in the lower extremity.  - No cystic structure found in the popliteal fossa.  LEFT: - No evidence of common femoral vein obstruction.  *See table(s)  above for measurements and observations. Electronically signed by Sherald Hess MD on 07/28/2020 at 4:01:04 PM.    Final    EKG: Independently reviewed.  Sinus tachycardia at 120 bpm, early LVH, some nonspecific T wave changes in 1 and 3.  Assessment/Plan Principal Problem:   Pulmonary embolism (HCC) Active Problems:   SIRS (systemic inflammatory response syndrome) (HCC)  Acute pulmonary embolus > 6 days of calf pain with 1 day of chest pain with associated shortness of breath tachycardia. 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 > PE demonstrated on CTA with moderate clot burden and RV LV ratio of 1.  Also noted is a right lower lobe consolidation is consistent with infarction. > Does have leukocytosis and fever (SIRS) this  is likely due to lung infarction seen on CT. > Preliminary lower extremity Dopplers did not note DVT, however entirety of clot may have traveled. - Continue to monitor for any signs of infection, follow-up respiratory panel for Covid and urinalysis, can consider pro-calcitonin if WBC/fever do not improve - Monitor in progressive - Continue IV heparin - Check echocardiogram  DVT prophylaxis: Heparin  Code Status:   Full Family Communication:  None on admission  Disposition Plan:   Patient is from:  Home  Anticipated DC to:  Home  Anticipated DC date:  1 to 3 days  Anticipated DC barriers: None  Consults called:  None  Admission status:  Observation, progressive   Severity of Illness: The appropriate patient status for this patient is OBSERVATION. Observation status is judged to be reasonable and necessary in order to provide the required intensity of service to ensure the patient's safety. The patient's presenting symptoms, physical exam findings, and initial radiographic and laboratory data in the context of their medical condition is felt to place them at decreased risk for further clinical deterioration. Furthermore, it is anticipated that the patient  will be medically stable for discharge from the hospital within 2 midnights of admission. The following factors support the patient status of observation.   " The patient's presenting symptoms include shortness of breath, chest pain, leg pain. " The physical exam findings include tachycardia, tachypnea, obestiy. " The initial radiographic and laboratory data are concerning for CTA showing PE with RV to LV ratio of 1, also significant for right lower lobe consolidations consistent with infarcted lung.  Leukocytosis to 18.3.   Synetta Fail MD [facility]  How to contact the [facility] Attending or Consulting provider 7A - 7P or covering provider during after hours 7P -7A, for this patient?   1. Check the care team in [facility] and look for a) attending/consulting [facility] provider listed and b) the [facility] team listed 2. Log into [email or web address] and use [place]'s universal password to access. If you do not have the password, please contact the hospital operator. 3. Locate the [facility] provider you are looking for under [facility] and page to a number that you can be directly reached. 4. If you still have difficulty reaching the provider, please page the [facility] (Director on Call) for the Hospitalists listed on amion for assistance.  07/28/2020, 7:11 PM

## 2020-07-28 NOTE — Progress Notes (Signed)
Lower extremity venous has been completed.   Preliminary results in CV Proc.   Blanch Media 07/28/2020 1:34 PM

## 2020-07-28 NOTE — ED Provider Notes (Signed)
[name] EMERGENCY DEPARTMENT Provider Note   CSN: [id number] Arrival date & time: 07/28/20  1119     History Chief Complaint  Patient presents with  . Leg Pain  . Flank Pain    Daniel Charles is a 35 y.o. male.  The history is provided by the patient.  Leg Pain Location:  Leg Time since incident:  5 days Leg location:  R lower leg Pain details:    Quality:  Sharp   Radiates to:  Does not radiate   Severity:  Moderate   Onset quality:  Gradual   Duration:  5 days   Timing:  Constant   Progression:  Unchanged Chronicity:  New Dislocation: no   Foreign body present:  No foreign bodies Prior injury to area:  No Relieved by:  Nothing Worsened by:  Nothing Ineffective treatments:  None tried Associated symptoms: no back pain and no fever   Flank Pain Associated symptoms include chest pain and shortness of breath. Pertinent negatives include no abdominal pain.  Chest Pain Pain location:  R chest Pain quality: sharp   Pain radiates to:  Does not radiate Onset quality:  Gradual Duration:  3 days Timing:  Intermittent Progression:  Waxing and waning Chronicity:  New Context: breathing   Relieved by: tylenol. Worsened by:  Deep breathing Ineffective treatments:  None tried Associated symptoms: shortness of breath   Associated symptoms: no abdominal pain, no back pain, no cough, no fever, no palpitations and no vomiting     HPI: A 35 year old patient with a history of obesity presents for evaluation of chest pain. Initial onset of pain was more than 6 hours ago. The patient's chest pain is well-localized, is sharp and is not worse with exertion. The patient's chest pain is not middle- or left-sided, is not described as heaviness/pressure/tightness and does not radiate to the arms/jaw/neck. The patient does not complain of nausea and denies diaphoresis. The patient has no history of stroke, has no history of peripheral artery disease, has not  smoked in the past 90 days, denies any history of treated diabetes, has no relevant family history of coronary artery disease (first degree relative at less than age 1), is not hypertensive and has no history of hypercholesterolemia.   History reviewed. No pertinent past medical history.  Patient Active Problem List   Diagnosis Date Noted  . Pulmonary embolism (HCC) 07/28/2020  . SIRS (systemic inflammatory response syndrome) (HCC) 07/28/2020    History reviewed. No pertinent surgical history.     Family History  Problem Relation Age of Onset  . Hypertension Mother   . Gout Father   . Deep vein thrombosis Neg Hx     Social History   Tobacco Use  . Smoking status: Never Smoker  . Smokeless tobacco: Never Used  Vaping Use  . Vaping Use: Never used  Substance Use Topics  . Alcohol use: No  . Drug use: Never    Home Medications Prior to Admission medications   Medication Sig Start Date End Date Taking? Authorizing Provider  acetaminophen (TYLENOL) 500 MG tablet Take 500-1,000 mg by mouth every 6 (six) hours as needed for mild pain (or discomfort).   Yes [provider]  naproxen sodium (ALEVE) 220 MG tablet Take 220-440 mg by mouth 2 (two) times daily as needed (for pain or headaches).   Yes [provider]  methocarbamol (ROBAXIN) 500 MG tablet Take 1 tablet (500 mg total) by mouth 3 (three) times daily. Patient not taking:  Reported on 07/28/2020 09/13/18   Daniel Salinas, PA-C    Allergies    Patient has no known allergies.  Review of Systems   Review of Systems  Constitutional: Negative for chills and fever.  HENT: Negative for ear pain and sore throat.   Eyes: Negative for pain and visual disturbance.  Respiratory: Positive for shortness of breath. Negative for cough.   Cardiovascular: Positive for chest pain. Negative for palpitations.  Gastrointestinal: Negative for abdominal pain and vomiting.  Genitourinary: Positive for flank pain.  Negative for dysuria and hematuria.  Musculoskeletal: Negative for arthralgias and back pain.       Leg pain  Skin: Negative for color change and rash.  Neurological: Negative for seizures and syncope.  All other systems reviewed and are negative.   Physical Exam Updated Vital Signs BP 112/88 (BP Location: Right Arm)   Pulse (!) 119   Temp 100.3 F (37.9 C) (Oral)   Resp 20   Ht 6\' 2"  (1.88 m)   Wt (!) 172.8 kg   SpO2 93%   BMI 48.90 kg/m   Physical Exam Vitals and nursing note reviewed.  Constitutional:      Appearance: He is well-developed and well-nourished. He is obese.  HENT:     Head: Normocephalic and atraumatic.  Eyes:     Conjunctiva/sclera: Conjunctivae normal.  Cardiovascular:     Rate and Rhythm: Regular rhythm. Tachycardia present.     Heart sounds: No murmur heard.   Pulmonary:     Effort: Pulmonary effort is normal. Tachypnea present. No respiratory distress.     Breath sounds: Normal breath sounds.  Abdominal:     Palpations: Abdomen is soft.     Tenderness: There is no abdominal tenderness.  Musculoskeletal:        General: No edema.     Cervical back: Neck supple.     Comments: R calf tenderness and mild swelling/warmth, no erythema  Skin:    General: Skin is warm and dry.     Capillary Refill: Capillary refill takes less than 2 seconds.  Neurological:     General: No focal deficit present.     Mental Status: He is alert and oriented to person, place, and time.  Psychiatric:        Mood and Affect: Mood and affect normal.     ED Results / Procedures / Treatments   Labs (all labs ordered are listed, but only abnormal results are displayed) Labs Reviewed  SARS CORONAVIRUS 2 BY RT PCR (HOSPITAL ORDER, PERFORMED IN [facility]) - Abnormal; Notable for the following components:      Result Value   SARS Coronavirus 2 POSITIVE (*)    All other components within normal limits  URINALYSIS, ROUTINE W REFLEX MICROSCOPIC - Abnormal;  Notable for the following components:   Color, Urine AMBER (*)    Specific Gravity, Urine 1.038 (*)    Bilirubin Urine SMALL (*)    Ketones, ur 5 (*)    Protein, ur 30 (*)    Leukocytes,Ua TRACE (*)    Bacteria, UA RARE (*)    All other components within normal limits  BASIC METABOLIC PANEL - Abnormal; Notable for the following components:   Glucose, Bld 116 (*)    All other components within normal limits  CBC - Abnormal; Notable for the following components:   WBC 18.3 (*)    RBC 6.38 (*)    MCV 71.6 (*)    MCH 21.9 (*)  RDW 15.8 (*)    All other components within normal limits  TROPONIN I (HIGH SENSITIVITY) - Abnormal; Notable for the following components:   Troponin I (High Sensitivity) 43 (*)    All other components within normal limits  HIV ANTIBODY (ROUTINE TESTING W REFLEX)  COMPREHENSIVE METABOLIC PANEL  CBC  HEPARIN LEVEL (UNFRACTIONATED)  TROPONIN I (HIGH SENSITIVITY)    EKG EKG Interpretation  Date/Time:  Monday July 28 2020 11:26:27 EST Ventricular Rate:  122 PR Interval:  124 QRS Duration: 78 QT Interval:  310 QTC Calculation: 441 R Axis:   -7 Text Interpretation: Sinus tachycardia Minimal voltage criteria for LVH, may be normal variant ( R in aVL ) Nonspecific ST and T wave abnormality Abnormal ECG No previous ECGs available Confirmed by Alvira Monday ([id number]) on 07/28/2020 3:42:29 PM   Radiology DG Chest 2 View  Result Date: 07/28/2020 CLINICAL DATA:  Shortness of breath EXAM: CHEST - 2 VIEW COMPARISON:  None. FINDINGS: Low lung volumes. Mild patchy density at the lung bases. No significant pleural effusion. No pneumothorax. Cardiomediastinal contours are within normal limits. IMPRESSION: Mild patchy atelectasis/consolidation at the lung bases. Electronically Signed   By: Guadlupe Spanish M.D.   On: 07/28/2020 12:17   DG Tibia/Fibula Right  Result Date: 07/28/2020 CLINICAL DATA:  posterior tib pain Right leg pain no known injury.  Swelling and  bruising. EXAM: RIGHT TIBIA AND FIBULA - 2 VIEW COMPARISON:  None. FINDINGS: The cortical margins of the tibia and fibula are intact. There is no evidence of fracture or other focal bone lesions. Knee and ankle alignment are maintained. Diffuse soft tissue edema throughout the lower leg. No soft tissue air. IMPRESSION: Diffuse soft tissue edema throughout the lower leg. No osseous abnormality. Electronically Signed   By: Narda Rutherford M.D.   On: 07/28/2020 15:55   CT Angio Chest PE W and/or Wo Contrast  Result Date: 07/28/2020 CLINICAL DATA:  PE suspected, high prob Leg pain.  Right flank pain. EXAM: CT ANGIOGRAPHY CHEST WITH CONTRAST TECHNIQUE: Multidetector CT imaging of the chest was performed using the standard protocol during bolus administration of intravenous contrast. Multiplanar CT image reconstructions and MIPs were obtained to evaluate the vascular anatomy. CONTRAST:  76mL OMNIPAQUE IOHEXOL 350 MG/ML SOLN COMPARISON:  Chest radiograph earlier today. FINDINGS: Cardiovascular: Examination is positive for acute bilateral pulmonary emboli. Evaluation is technically limited due to soft tissue attenuation from habitus and contrast bolus timing. Filling defects are seen on the right involving the distal main pulmonary artery extending into the upper, middle, and lower lobar pulmonary arteries, with segmental and subsegmental involvement of the lower lobe. Filling defects on the left involve the left lower lobar, upper and lower segmental and scattered subsegmental pulmonary arteries. Thromboembolic burden is moderate. RV to LV ratio is borderline at 1. The heart is normal in size. No aortic dissection. Conventional branching pattern from the aortic arch. No pericardial effusion. Mediastinum/Nodes: Enlarged low right hilar node measuring 19 mm. Left lower paratracheal node measures 12 mm, series 5, image 36. Additional smaller mediastinal lymph nodes that are prominent but not enlarged by size criteria. No  visualized thyroid nodule. Decompressed esophagus. Lungs/Pleura: Consolidation in the right lower lobe abuts the pleura, has surrounding ground-glass opacity and is suspicious for pulmonary infarct. There is linear atelectasis in the left lower lobe and right middle lobe. Minimal right pleural thickening/trace effusion. The trachea and central bronchi are patent. Upper Abdomen: Enlarged liver with suspected steatosis, partially included. Musculoskeletal: There are no acute or  suspicious osseous abnormalities. Review of the MIP images confirms the above findings. IMPRESSION: 1. Examination is positive for acute bilateral pulmonary emboli. Thromboembolic burden is moderate. Borderline RV to LV ratio at 1. 2. Right lower lobe consolidation with surrounding ground-glass opacity, suspicious for pulmonary infarct. 3. Enlarged low right hilar and mediastinal lymph nodes are likely reactive. 4. Enlarged liver with suspected steatosis, partially included. Critical Value/emergent results were called by telephone at the time of interpretation on 07/28/2020 at 5:47 pm to provider Messner , who verbally acknowledged these results. Electronically Signed   By: Narda Rutherford M.D.   On: 07/28/2020 17:56   VAS [place] LOWER EXTREMITY VENOUS (DVT) (ONLY [facility] & [facility])  Result Date: 07/28/2020  Lower Venous DVT Study Indications: Pain, Swelling, and Edema.  Limitations: Poor ultrasound/tissue interface and body habitus. Comparison Study: 07/11/17 previous Performing Technologist: Blanch Media RVS  Examination Guidelines: A complete evaluation includes B-mode imaging, spectral Doppler, color Doppler, and power Doppler as needed of all accessible portions of each vessel. Bilateral testing is considered an integral part of a complete examination. Limited examinations for reoccurring indications may be performed as noted. The reflux portion of the exam is performed with the patient in reverse Trendelenburg.   +---------+---------------+---------+-----------+----------+-------------------+ RIGHT    CompressibilityPhasicitySpontaneityPropertiesThrombus Aging      +---------+---------------+---------+-----------+----------+-------------------+ CFV      Full           Yes      Yes                                      +---------+---------------+---------+-----------+----------+-------------------+ SFJ      Full                                                             +---------+---------------+---------+-----------+----------+-------------------+ FV Prox  Full                                                             +---------+---------------+---------+-----------+----------+-------------------+ FV Mid   Full           Yes      Yes                                      +---------+---------------+---------+-----------+----------+-------------------+ FV DistalFull           Yes      Yes                                      +---------+---------------+---------+-----------+----------+-------------------+ PFV      Full                                                             +---------+---------------+---------+-----------+----------+-------------------+  POP      Full           Yes      Yes                                      +---------+---------------+---------+-----------+----------+-------------------+ PTV      Full                                                             +---------+---------------+---------+-----------+----------+-------------------+ PERO                                                  Not well visualized +---------+---------------+---------+-----------+----------+-------------------+   +----+---------------+---------+-----------+----------+--------------+ LEFTCompressibilityPhasicitySpontaneityPropertiesThrombus Aging +----+---------------+---------+-----------+----------+--------------+ CFV Full           Yes       Yes                                 +----+---------------+---------+-----------+----------+--------------+     Summary: RIGHT: - There is no evidence of deep vein thrombosis in the lower extremity.  - No cystic structure found in the popliteal fossa.  LEFT: - No evidence of common femoral vein obstruction.  *See table(s) above for measurements and observations. Electronically signed by Sherald Hess MD on 07/28/2020 at 4:01:04 PM.    Final     Procedures Procedures   Medications Ordered in ED Medications  sodium chloride flush (NS) 0.9 % injection 3 mL (3 mLs Intravenous Not Given 07/28/20 2306)  acetaminophen (TYLENOL) tablet 650 mg (has no administration in time range)    Or  acetaminophen (TYLENOL) suppository 650 mg (has no administration in time range)  polyethylene glycol (MIRALAX / GLYCOLAX) packet 17 g (has no administration in time range)  0.9 %  sodium chloride infusion ( Intravenous New Bag/Given 07/28/20 2310)  heparin ADULT infusion 100 units/mL (25000 units/255mL) (2,000 Units/hr Intravenous New Bag/Given 07/28/20 2055)  morphine 4 MG/ML injection 4 mg (4 mg Intravenous Given 07/28/20 1740)  lactated ringers bolus 1,000 mL (0 mLs Intravenous Stopped 07/28/20 1921)  acetaminophen (TYLENOL) tablet 650 mg (650 mg Oral Given 07/28/20 1744)  iohexol (OMNIPAQUE) 350 MG/ML injection 60 mL (60 mLs Intravenous Contrast Given 07/28/20 1723)  heparin bolus via infusion 7,000 Units (7,000 Units Intravenous Bolus from Bag 07/28/20 2055)    ED Course  I have reviewed the triage vital signs and the nursing notes.  Pertinent labs & imaging results that were available during my care of the patient were reviewed by me and considered in my medical decision making (see chart for details).    MDM Rules/Calculators/A&P HEAR Score: 1                        This is a 35 year old male with no reported past medical history presents emergency department for evaluation of right calf pain as well of  right-sided chest and right anterior chest pain.  Patient reports approximately 6 days ago (Wednesday), he developed a severe sharp and shooting pain in his  right calf that has been worsening, he does not recall any mechanism of injury, has never had pain like this before.  He feels the pain slightly at rest, but is mostly exacerbated by movement and palpation.  He has no history of DVT or coagulation disorder.  Approximately 3 days ago (Friday), the patient started develop a right lateral chest wall pain that was worse with deep inspiration, has been using Tylenol which is only mildly improving the symptoms, today the pain seemed move into his right anterior chest and has been on constant causing him to feel short of breath.  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  On exam the patient is a morbidly obese, febrile to 100.7, warm to the touch, hemodynamically stable, tachycardic and tachypneic.  He has point tenderness to the posterior calf with some associated warmth and swelling.  2+ DP pulses.  His chest wall I am unable to reproduce tenderness, his lung sounds are clear.  Concern for DVT versus thromboembolic disease in the form of pulmonary embolism, right lower extremity Doppler studies did not show any evidence of deep vein thrombosis.  Given the patient is tachycardic, tachypneic, low-grade fever, will obtain Covid test as well as a CT of the chest to rule out thromboembolic disease.  CT of the chest showed multiple pulmonary emboli.  He remains tachycardic, he is not hypoxic or hypotensive.  Patient was started on heparin admitted to the hospital.  He was also tested and returned positive for COVID-19.  Is not requiring any further oxygen, do not believe any treatments are initiated at this time for his COVID-19.  Final Clinical Impression(s) / ED Diagnoses Final diagnoses:  Multiple subsegmental pulmonary emboli without acute cor pulmonale [facility](HCC)    Rx / DC  Orders ED Discharge Orders    None       Kathleen LimeHooven, Lamarkus Nebel, MD 07/28/20 [id number]    Marily MemosMesner, Jason, MD 07/29/20 1343

## 2020-07-28 NOTE — ED Triage Notes (Addendum)
Pt arrives via EMS for pain in right lower calf. Shooting pain about a week ago. Pt states he has pain in right flank- hard to take a deep breath due to the pain. Makes him SOB. Pt is a Naval architect. Denies dizziness, chest pain. Pt HR 130 upon arrival for EMS. BP 135/86, 95% on room air.

## 2020-07-29 ENCOUNTER — Inpatient Hospital Stay (HOSPITAL_COMMUNITY): Payer: Commercial Managed Care - PPO

## 2020-07-29 ENCOUNTER — Observation Stay (HOSPITAL_COMMUNITY): Payer: Commercial Managed Care - PPO

## 2020-07-29 DIAGNOSIS — I2694 Multiple subsegmental pulmonary emboli without acute cor pulmonale: Secondary | ICD-10-CM

## 2020-07-29 DIAGNOSIS — I2699 Other pulmonary embolism without acute cor pulmonale: Secondary | ICD-10-CM | POA: Diagnosis present

## 2020-07-29 DIAGNOSIS — Z79899 Other long term (current) drug therapy: Secondary | ICD-10-CM | POA: Diagnosis not present

## 2020-07-29 DIAGNOSIS — J1282 Pneumonia due to coronavirus disease 2019: Secondary | ICD-10-CM | POA: Diagnosis present

## 2020-07-29 DIAGNOSIS — D6859 Other primary thrombophilia: Secondary | ICD-10-CM | POA: Diagnosis present

## 2020-07-29 DIAGNOSIS — Z713 Dietary counseling and surveillance: Secondary | ICD-10-CM | POA: Diagnosis not present

## 2020-07-29 DIAGNOSIS — R6510 Systemic inflammatory response syndrome (SIRS) of non-infectious origin without acute organ dysfunction: Secondary | ICD-10-CM | POA: Diagnosis present

## 2020-07-29 DIAGNOSIS — J9602 Acute respiratory failure with hypercapnia: Secondary | ICD-10-CM | POA: Diagnosis not present

## 2020-07-29 DIAGNOSIS — U071 COVID-19: Secondary | ICD-10-CM | POA: Diagnosis present

## 2020-07-29 DIAGNOSIS — Z6841 Body Mass Index (BMI) 40.0 and over, adult: Secondary | ICD-10-CM | POA: Diagnosis not present

## 2020-07-29 DIAGNOSIS — J9601 Acute respiratory failure with hypoxia: Secondary | ICD-10-CM | POA: Diagnosis present

## 2020-07-29 DIAGNOSIS — M79604 Pain in right leg: Secondary | ICD-10-CM | POA: Diagnosis present

## 2020-07-29 LAB — COMPREHENSIVE METABOLIC PANEL
ALT: 28 U/L (ref 0–44)
ALT: 25 U/L (ref 0–44)
AST: 31 U/L (ref 15–41)
AST: 29 U/L (ref 15–41)
Albumin: 2.6 g/dL — ABNORMAL LOW (ref 3.5–5.0)
Albumin: 2.7 g/dL — ABNORMAL LOW (ref 3.5–5.0)
Alkaline Phosphatase: 47 U/L (ref 38–126)
Alkaline Phosphatase: 43 U/L (ref 38–126)
Anion gap: 10 (ref 5–15)
Anion gap: 11 (ref 5–15)
BUN: 6 mg/dL (ref 6–20)
BUN: 8 mg/dL (ref 6–20)
CO2: 21 mmol/L — ABNORMAL LOW (ref 22–32)
CO2: 21 mmol/L — ABNORMAL LOW (ref 22–32)
Calcium: 8.5 mg/dL — ABNORMAL LOW (ref 8.9–10.3)
Calcium: 8.7 mg/dL — ABNORMAL LOW (ref 8.9–10.3)
Chloride: 104 mmol/L (ref 98–111)
Chloride: 102 mmol/L (ref 98–111)
Creatinine, Ser: 1.16 mg/dL (ref 0.61–1.24)
Creatinine, Ser: 1.06 mg/dL (ref 0.61–1.24)
GFR, Estimated: >60 mL/min (ref >60)
GFR, Estimated: >60 mL/min (ref >60)
Glucose, Bld: 108 mg/dL — ABNORMAL HIGH (ref 70–99)
Glucose, Bld: 99 mg/dL (ref 70–99)
Potassium: 3.9 mmol/L (ref 3.5–5.1)
Potassium: 4 mmol/L (ref 3.5–5.1)
Sodium: 135 mmol/L (ref 135–145)
Sodium: 134 mmol/L — ABNORMAL LOW (ref 135–145)
Total Bilirubin: 2.9 mg/dL — ABNORMAL HIGH (ref 0.3–1.2)
Total Bilirubin: 2.3 mg/dL — ABNORMAL HIGH (ref 0.3–1.2)
Total Protein: 6.8 g/dL (ref 6.5–8.1)
Total Protein: 6.8 g/dL (ref 6.5–8.1)

## 2020-07-29 LAB — CBC
HCT: 39.9 % (ref 39.0–52.0)
HCT: 39.8 % (ref 39.0–52.0)
Hemoglobin: 12.5 g/dL — ABNORMAL LOW (ref 13.0–17.0)
Hemoglobin: 12.2 g/dL — ABNORMAL LOW (ref 13.0–17.0)
MCH: 22 pg — ABNORMAL LOW (ref 26.0–34.0)
MCH: 21.7 pg — ABNORMAL LOW (ref 26.0–34.0)
MCHC: 31.3 g/dL (ref 30.0–36.0)
MCHC: 30.7 g/dL (ref 30.0–36.0)
MCV: 70.4 fL — ABNORMAL LOW (ref 80.0–100.0)
MCV: 70.9 fL — ABNORMAL LOW (ref 80.0–100.0)
Platelets: 249 10*3/uL (ref 150–400)
Platelets: 255 10*3/uL (ref 150–400)
RBC: 5.67 MIL/uL (ref 4.22–5.81)
RBC: 5.61 MIL/uL (ref 4.22–5.81)
RDW: 15 % (ref 11.5–15.5)
RDW: 15.3 % (ref 11.5–15.5)
WBC: 20.1 10*3/uL — ABNORMAL HIGH (ref 4.0–10.5)
WBC: 18.5 10*3/uL — ABNORMAL HIGH (ref 4.0–10.5)
nRBC: 0 % (ref 0.0–0.2)
nRBC: 0 % (ref 0.0–0.2)

## 2020-07-29 LAB — ECHOCARDIOGRAM COMPLETE
Area-P 1/2: 3.72 cm2
Calc EF: 70.9 %
Height: 74 in
S' Lateral: 3.8 cm
Single Plane A2C EF: 53 %
Single Plane A4C EF: 82.4 %
Weight: 6151.72 oz

## 2020-07-29 LAB — PROCALCITONIN: Procalcitonin: 0.29 ng/mL

## 2020-07-29 LAB — GLUCOSE, CAPILLARY
Glucose-Capillary: 83 mg/dL (ref 70–99)
Glucose-Capillary: 112 mg/dL — ABNORMAL HIGH (ref 70–99)

## 2020-07-29 LAB — D-DIMER, QUANTITATIVE: D-Dimer, Quant: 1.53 ug/mL-FEU — ABNORMAL HIGH (ref 0.00–0.50)

## 2020-07-29 LAB — C-REACTIVE PROTEIN: CRP: 35.7 mg/dL — ABNORMAL HIGH (ref <1.0)

## 2020-07-29 LAB — HEPARIN LEVEL (UNFRACTIONATED)
Heparin Unfractionated: 0.32 IU/mL (ref 0.30–0.70)
Heparin Unfractionated: <0.1 IU/mL — ABNORMAL LOW (ref 0.30–0.70)
Heparin Unfractionated: 0.18 IU/mL — ABNORMAL LOW (ref 0.30–0.70)

## 2020-07-29 LAB — BRAIN NATRIURETIC PEPTIDE: B Natriuretic Peptide: 35.8 pg/mL (ref 0.0–100.0)

## 2020-07-29 MED ORDER — OXYCODONE-ACETAMINOPHEN 5-325 MG PO TABS
1.0000 | ORAL_TABLET | Freq: Four times a day (QID) | ORAL | Status: DC | PRN
  Administered 2020-07-29 – 2020-08-01 (×5): 1 via ORAL
  Filled 2020-07-29 (×7): qty 1

## 2020-07-29 MED ORDER — PERFLUTREN LIPID MICROSPHERE
1.0000 mL | INTRAVENOUS | Status: AC | PRN
  Administered 2020-07-29: 2 mL via INTRAVENOUS
  Filled 2020-07-29: qty 10

## 2020-07-29 MED ORDER — PANTOPRAZOLE SODIUM 40 MG PO TBEC
40.0000 mg | DELAYED_RELEASE_TABLET | Freq: Every day | ORAL | Status: DC
  Administered 2020-07-29 – 2020-08-01 (×4): 40 mg via ORAL
  Filled 2020-07-29 (×3): qty 1

## 2020-07-29 MED ORDER — MORPHINE SULFATE (PF) 2 MG/ML IV SOLN: 2.0000 mg | Freq: Four times a day (QID) | INTRAVENOUS | Status: DC | PRN

## 2020-07-29 MED ORDER — SODIUM CHLORIDE 0.9 % IV SOLN: INTRAVENOUS | Status: AC

## 2020-07-29 MED ORDER — SODIUM CHLORIDE 0.9 % IV SOLN
200.0000 mg | Freq: Once | INTRAVENOUS | Status: AC
  Administered 2020-07-29: 200 mg via INTRAVENOUS
  Filled 2020-07-29 (×2): qty 40

## 2020-07-29 MED ORDER — METHYLPREDNISOLONE SODIUM SUCC 125 MG IJ SOLR
60.0000 mg | Freq: Two times a day (BID) | INTRAMUSCULAR | Status: DC
  Administered 2020-07-29 – 2020-08-01 (×7): 60 mg via INTRAVENOUS
  Filled 2020-07-29 (×7): qty 2

## 2020-07-29 MED ORDER — HEPARIN BOLUS VIA INFUSION
3500.0000 [IU] | Freq: Once | INTRAVENOUS | Status: AC
  Administered 2020-07-29: 3500 [IU] via INTRAVENOUS
  Filled 2020-07-29: qty 3500

## 2020-07-29 MED ORDER — HEPARIN (PORCINE) 25000 UT/250ML-% IV SOLN
2400.0000 [IU]/h | INTRAVENOUS | Status: DC
  Administered 2020-07-29 – 2020-07-30 (×2): 2400 [IU]/h via INTRAVENOUS
  Filled 2020-07-29 (×3): qty 250

## 2020-07-29 MED ORDER — SODIUM CHLORIDE 0.9 % IV SOLN
100.0000 mg | Freq: Every day | INTRAVENOUS | Status: DC
  Administered 2020-07-30 – 2020-08-01 (×3): 100 mg via INTRAVENOUS
  Filled 2020-07-29: qty 100
  Filled 2020-07-29 (×3): qty 20

## 2020-07-29 NOTE — Progress Notes (Signed)
ANTICOAGULATION CONSULT NOTE  Pharmacy Consult for heparin Indication: pulmonary embolus  No Known Allergies  Patient Measurements: Height: 6\' 2"  (188 cm) Weight: (!) 174.4 kg (384 lb 7.7 oz) IBW/kg (Calculated) : 82.2 Heparin Dosing Weight: 126kg  Vital Signs: Temp: 99.9 F (37.7 C) (02/01 1800) Temp Source: Oral (02/01 1710) BP: 107/68 (02/01 1800) Pulse Rate: 116 (02/01 1800)  Labs: Recent Labs    07/28/20 1134 07/28/20 1803 07/28/20 1958 07/29/20 0414 07/29/20 0801 07/29/20 1447 07/29/20 1755  HGB 14.0  --   --  12.5* 12.2*  --   --   HCT 45.7  --   --  39.9 39.8  --   --   PLT 301  --   --  249 255  --   --   HEPARINUNFRC  --   --   --  0.32  --  <0.10* 0.18*  CREATININE 1.22  --   --  1.16 1.06  --   --   TROPONINIHS  --  43* 8  --   --   --   --     Estimated Creatinine Clearance: 163.9 mL/min (by C-G formula based on SCr of 1.06 mg/dL).  Assessment: 35 y.o. male with PE on heparin.  -heparin level= 0.18 (this is repeat level as the previous was undetectable)  Goal of Therapy:  Heparin level 0.3-0.7 units/ml Monitor platelets by anticoagulation protocol: Yes   Plan:   -Heparin bolus 3500 units and increase infusion to 2400 units/hr -Heparin level in 6 hours and daily wth CBC daily  31, PharmD Clinical Pharmacist **Pharmacist phone directory can now be found on [email or web address] ([email or web address]).  Listed under [facility].

## 2020-07-29 NOTE — Progress Notes (Signed)
Patient received via stretcher with Josh R.N. from E.R.  alert and orin. Orint. To room R.N. into assess. Patient.

## 2020-07-29 NOTE — Progress Notes (Addendum)
Initial Nutrition Assessment  DOCUMENTATION CODES:   Obesity unspecified  INTERVENTION:   Change diet to vegetarian     Double protein portions at meals   MVI daily   NUTRITION DIAGNOSIS:   Increased nutrient needs related to acute illness as evidenced by estimated needs.  GOAL:   Patient will meet greater than or equal to 90% of their needs  MONITOR:   PO intake,Supplement acceptance,Weight trends,Labs,I & O's  REASON FOR ASSESSMENT:   Malnutrition Screening Tool    ASSESSMENT:   Patient with no known PMH. Presents this admission with COVID infection and acute pulmonary embolism.   Patient noticed a decrease in appetite over the last two weeks due to ongoing issues with breathing. States during this time he consumed one meal daily that consisted of grilled cheese or baked potato with broccoli. Prior to this patient was eating 2-3 meals daily. Appetite slow to progress this admission. He tried Ensure but does like the taste. Discussed the importance of protein intake for preservation of lean body mass. Patient willing to more protein with meals.   Patient endorses a UBW of 400 lb and a 20 lb weight loss in the last 2 weeks. Records lack documentation of weights over the last year.   UOP: 300 ml since admit  Medications: solumedrol  Labs: Na 134 CBG 83-116  Diet Order:   Diet Order            Diet regular Room service appropriate? Yes; Fluid consistency: Thin  Diet effective now                 EDUCATION NEEDS:   Education needs have been addressed  Skin:  Skin Assessment: Reviewed RN Assessment  Last BM:  1/29  Height:   Ht Readings from Last 1 Encounters:  07/28/20 6\' 2"  (1.88 m)    Weight:   Wt Readings from Last 1 Encounters:  07/29/20 (!) 174.4 kg    BMI:  Body mass index is 49.36 kg/m.  Estimated Nutritional Needs:   Kcal:  2300-2500 kcal  Protein:  115-130 grams  Fluid:  >/= 2 L/day  09/26/20 RD, LDN Clinical  Nutrition Pager listed in AMION

## 2020-07-29 NOTE — Progress Notes (Signed)
ANTICOAGULATION CONSULT NOTE  Pharmacy Consult for heparin Indication: pulmonary embolus  No Known Allergies  Patient Measurements: Height: 6\' 2"  (188 cm) Weight: (!) 172.8 kg (380 lb 14.4 oz) IBW/kg (Calculated) : 82.2 Heparin Dosing Weight: 126kg  Vital Signs: Temp: 99.4 F (37.4 C) (02/01 0400) Temp Source: Oral (02/01 0400) BP: 119/73 (02/01 0400) Pulse Rate: 108 (02/01 0400)  Labs: Recent Labs    07/28/20 1134 07/28/20 1803 07/28/20 1958 07/29/20 0414  HGB 14.0  --   --  12.5*  HCT 45.7  --   --  39.9  PLT 301  --   --  249  HEPARINUNFRC  --   --   --  0.32  CREATININE 1.22  --   --  1.16  TROPONINIHS  --  43* 8  --     Estimated Creatinine Clearance: 148.9 mL/min (by C-G formula based on SCr of 1.16 mg/dL).  Assessment: 35 y.o. male with PE for heparin  Goal of Therapy:  Heparin level 0.3-0.7 units/ml Monitor platelets by anticoagulation protocol: Yes   Plan:  Continue Heparin at current rate  Check heparin level in 6 hours to verify   31, PharmD, BCPS

## 2020-07-29 NOTE — Progress Notes (Signed)
   07/29/20 0009  Assess: MEWS Score  Temp (!) 100.7 F (38.2 C)  BP 132/72  Pulse Rate (!) 115  ECG Heart Rate (!) 113  Resp (!) 24  Level of Consciousness Alert  SpO2 94 %  O2 Device Room Air  Assess: MEWS Score  MEWS Temp 1  MEWS Systolic 0  MEWS Pulse 2  MEWS RR 1  MEWS LOC 0  MEWS Score 4  MEWS Score Color Red  Assess: if the MEWS score is Yellow or Red  Were vital signs taken at a resting state? Yes  Focused Assessment Change from prior assessment (see assessment flowsheet)  MEWS guidelines implemented *See Row Information* Yes  Treat  MEWS Interventions Administered prn meds/treatments;Escalated (See documentation below)  Pain Scale 0-10  Pain Score 4  Pain Type Acute pain  Pain Location Chest  Pain Orientation Right  Pain Descriptors / Indicators Sharp  Pain Frequency Intermittent  Pain Onset With Activity  Pain Intervention(s) Medication (See eMAR);Repositioned  Take Vital Signs  Increase Vital Sign Frequency  Red: Q 1hr X 4 then Q 4hr X 4, if remains red, continue Q 4hrs  Escalate  MEWS: Escalate Red: discuss with charge nurse/RN and provider, consider discussing with RRT  Notify: Charge Nurse/RN  Name of Charge Nurse/RN Notified SARON RN  Date Charge Nurse/RN Notified 07/29/20  Time Charge Nurse/RN Notified 0018 (CHARTED WRONG TIME)  Notify: Provider  Provider Name/Title DR. [facility]  Date Provider Notified 07/29/20  Time Provider Notified 0018  Notification Type Page  Notification Reason Change in status  Dr. Martyn Malay text paged the vital signs. Patient had Fever and Tachycardia and inc in resp in E.R. This is first for our floor. Jasmine December RN aware

## 2020-07-29 NOTE — Progress Notes (Signed)
PROGRESS NOTE                                                                             PROGRESS NOTE                                                                                                                                                                                                             Patient Demographics:    Daniel Charles, is a 35 y.o. male, DOB - [birth year], SXJ:[id number]  Outpatient Primary MD for the patient is Patient, No Pcp Per    LOS - 0  Admit date - 07/28/2020    Chief Complaint  Patient presents with  . Leg Pain  . Flank Pain       Brief Narrative    Daniel Charles is a 35 y.o. male with no known past medical history who presents with ongoing right leg pain and new chest pain shortness of breath.  He is unvaccinated against Covid, his work-up was significant for acute PE, patient was admitted for further management.   Subjective:    Daniel Charles today ports fever, chills, generalized weakness and fatigue, reports pleuritic chest pain .   Assessment  & Plan :    Principal Problem:   Pulmonary embolism (HCC) Active Problems:   SIRS (systemic inflammatory response syndrome) (HCC)  Acute Hypoxic Resp.  -Appears to be multifactorial, patient this morning he is on 2 L nasal cannula, this is in the setting of COVID-19 infection, pulmonary PE.  Acute Pulmonary embolism -Patient works as a [employer], denies any family history of DVTs or PEs. -He is COVID-19 positive, most likely contributing to hypercoagulable state causing his PE. -He is with chronic lower extremity pain, venous Doppler negative in the right lower extremity, will obtain left lower extremity venous Dopplers. -He is on heparin GTT, will continue for 24 to 48 hours before transitioning to DOAC in the setting of RV LV ratio of 1. -Echocardiogram is pending.  COVID-19 infection -Is unvaccinated. -  evidence of opacity on  CT  chest, opacity may be related to Covid versus pulmonary infarction is aspiration pneumonia -Continue with remdesivir. -CRP significantly elevated at 35, will start on steroids. -Will check procalcitonin, and if elevated will add antibiotics. - Encouraged the patient to sit up in chair in the daytime use I-S and flutter valve for pulmonary toiletry and then prone in bed when at night.  Will advance activity and titrate down oxygen as possible.      SpO2: 96 % O2 Flow Rate (L/min): 2 L/min  Recent Labs  Lab 07/28/20 1134 07/28/20 1521 07/29/20 0414 07/29/20 0801  WBC 18.3*  --  20.1* 18.5*  PLT 301  --  249 255  DDIMER  --   --   --  1.53*  AST  --   --  31  --   ALT  --   --  28  --   ALKPHOS  --   --  47  --   BILITOT  --   --  2.9*  --   ALBUMIN  --   --  2.6*  --   SARSCOV2NAA  --  POSITIVE*  --   --        ABG  No results found for: PHART, PCO2ART, PO2ART, HCO3, TCO2, ACIDBASEDEF, O2SAT       Condition - Extremely Guarded  Family Communication  : Patient is awake and coherent, discussed case in details with the patient, all of his questions were answered, I tried to call mother on  available phone number, unable to leave voicemail.  Code Status :  Full   Consults  :  None  PUD Prophylaxis :   Disposition Plan  :    Status is: Observation  The patient will require care spanning > 2 midnights and should be moved to inpatient because: IV treatments appropriate due to intensity of illness or inability to take PO  Dispo: The patient is from: Home              Anticipated d/c is to: Home              Anticipated d/c date is: 3 days              Patient currently is medically stable to d/c.   Difficult to place patient No      DVT Prophylaxis  :  Heparin GTT  Lab Results  Component Value Date   PLT 255 07/29/2020    Diet :  Diet Order            Diet regular Room service appropriate? Yes; Fluid consistency: Thin  Diet effective now                   Inpatient Medications  Scheduled Meds: . sodium chloride flush  3 mL Intravenous Q12H   Continuous Infusions: . heparin 2,000 Units/hr (07/29/20 0449)  . remdesivir 200 mg in sodium chloride 0.9% 250 mL IVPB     Followed by  . [START ON 07/30/2020] remdesivir 100 mg in NS 100 mL     PRN Meds:.acetaminophen **OR** acetaminophen, oxyCODONE-acetaminophen **OR** morphine injection, perflutren lipid microspheres (DEFINITY) IV suspension, polyethylene glycol  Antibiotics  :    Anti-infectives (From admission, onward)   Start     Dose/Rate Route Frequency Ordered Stop   07/30/20 1000  remdesivir 100 mg in sodium chloride 0.9 % 100 mL IVPB       "Followed by" Linked Group Details   100 mg 200  mL/hr over 30 Minutes Intravenous Daily 07/29/20 0724 08/03/20 0959   07/29/20 0900  remdesivir 200 mg in sodium chloride 0.9% 250 mL IVPB       "Followed by" Linked Group Details   200 mg 580 mL/hr over 30 Minutes Intravenous Once 07/29/20 0724          Huey Bienenstock M.D on 07/29/2020 at 10:07 AM  To page go to [email or web address]   [facility] -  Office  [phone]    Objective:   Vitals:   07/29/20 0500 07/29/20 0648 07/29/20 0720 07/29/20 0728  BP: 127/79 128/73  128/71  Pulse: (!) 107 (!) 107    Resp: (!) 23 (!) 24    Temp: 99.5 F (37.5 C) 99.1 F (37.3 C)  98.3 F (36.8 C)  TempSrc: Oral Oral  Oral  SpO2: 97% 96%    Weight:   (!) 174.4 kg   Height:        Wt Readings from Last 3 Encounters:  07/29/20 (!) 174.4 kg  09/13/18 (!) 185.5 kg     Intake/Output Summary (Last 24 hours) at 07/29/2020 1007 Last data filed at 07/29/2020 0649 Gross per 24 hour  Intake 2400 ml  Output 300 ml  Net 2100 ml     Physical Exam  Awake Alert, No new F.N deficits, Normal affect Symmetrical Chest wall movement, Good air movement bilaterally, diminished air entry at the bases. RRR,No Gallops,Rubs or new Murmurs, No Parasternal Heave +ve B.Sounds, Abd Soft, No tenderness,   No rebound - guarding or rigidity. No Cyanosis, Clubbing or edema, No new Rash or bruise      Data Review:    CBC Recent Labs  Lab 07/28/20 1134 07/29/20 0414 07/29/20 0801  WBC 18.3* 20.1* 18.5*  HGB 14.0 12.5* 12.2*  HCT 45.7 39.9 39.8  PLT 301 249 255  MCV 71.6* 70.4* 70.9*  MCH 21.9* 22.0* 21.7*  MCHC 30.6 31.3 30.7  RDW 15.8* 15.0 15.3    Recent Labs  Lab 07/28/20 1134 07/29/20 0414 07/29/20 0801  NA 138 135  --   K 4.1 3.9  --   CL 102 104  --   CO2 25 21*  --   GLUCOSE 116* 108*  --   BUN 7 6  --   CREATININE 1.22 1.16  --   CALCIUM 9.1 8.5*  --   AST  --  31  --   ALT  --  28  --   ALKPHOS  --  47  --   BILITOT  --  2.9*  --   ALBUMIN  --  2.6*  --   DDIMER  --   --  1.53*    ------------------------------------------------------------------------------------------------------------------ No results for input(s): CHOL, HDL, LDLCALC, TRIG, CHOLHDL, LDLDIRECT in the last 72 hours.  No results found for: HGBA1C ------------------------------------------------------------------------------------------------------------------ No results for input(s): TSH, T4TOTAL, T3FREE, THYROIDAB in the last 72 hours.  Invalid input(s): FREET3  Cardiac Enzymes No results for input(s): CKMB, TROPONINI, MYOGLOBIN in the last 168 hours.  Invalid input(s): CK ------------------------------------------------------------------------------------------------------------------ No results found for: BNP  Micro Results Recent Results (from the past 240 hour(s))  SARS Coronavirus 2 by RT PCR (hospital order, performed in [facility] hospital lab) Nasopharyngeal Nasopharyngeal Swab     Status: Abnormal   Collection Time: 07/28/20  3:21 PM   Specimen: Nasopharyngeal Swab  Result Value Ref Range Status   SARS Coronavirus 2 POSITIVE (A) NEGATIVE Final    Comment: RESULT CALLED TO, READ BACK BY AND  VERIFIED WITH: RN JOSH N 2005 [id number] FCP (NOTE) SARS-CoV-2 target nucleic  acids are DETECTED  SARS-CoV-2 RNA is generally detectable in upper respiratory specimens  during the acute phase of infection.  Positive results are indicative  of the presence of the identified virus, but do not rule out bacterial infection or co-infection with other pathogens not detected by the test.  Clinical correlation with patient history and  other diagnostic information is necessary to determine patient infection status.  The expected result is negative.  Fact Sheet for Patients:   BoilerBrush.com.cy   Fact Sheet for Healthcare Providers:   https://pope.com/    This test is not yet approved or cleared by the [place] FDA and  has been authorized for detection and/or diagnosis of SARS-CoV-2 by FDA under an Emergency Use Authorization (EUA).  This EUA will remain in effect (meaning this test can be  used) for the duration of  the COVID-19 declaration under Section 564(b)(1) of the Act, 21 U.S.C. section 360-bbb-3(b)(1), unless the authorization is terminated or revoked sooner.  Performed at [facility], [address], [place], [place] [address]     Radiology Reports DG Chest 2 View  Result Date: 07/28/2020 CLINICAL DATA:  Shortness of breath EXAM: CHEST - 2 VIEW COMPARISON:  None. FINDINGS: Low lung volumes. Mild patchy density at the lung bases. No significant pleural effusion. No pneumothorax. Cardiomediastinal contours are within normal limits. IMPRESSION: Mild patchy atelectasis/consolidation at the lung bases. Electronically Signed   By: Guadlupe Spanish M.D.   On: 07/28/2020 12:17   DG Tibia/Fibula Right  Result Date: 07/28/2020 CLINICAL DATA:  posterior tib pain Right leg pain no known injury.  Swelling and bruising. EXAM: RIGHT TIBIA AND FIBULA - 2 VIEW COMPARISON:  None. FINDINGS: The cortical margins of the tibia and fibula are intact. There is no evidence of fracture or other focal bone lesions. Knee and  ankle alignment are maintained. Diffuse soft tissue edema throughout the lower leg. No soft tissue air. IMPRESSION: Diffuse soft tissue edema throughout the lower leg. No osseous abnormality. Electronically Signed   By: Narda Rutherford M.D.   On: 07/28/2020 15:55   CT Angio Chest PE W and/or Wo Contrast  Result Date: 07/28/2020 CLINICAL DATA:  PE suspected, high prob Leg pain.  Right flank pain. EXAM: CT ANGIOGRAPHY CHEST WITH CONTRAST TECHNIQUE: Multidetector CT imaging of the chest was performed using the standard protocol during bolus administration of intravenous contrast. Multiplanar CT image reconstructions and MIPs were obtained to evaluate the vascular anatomy. CONTRAST:  27mL OMNIPAQUE IOHEXOL 350 MG/ML SOLN COMPARISON:  Chest radiograph earlier today. FINDINGS: Cardiovascular: Examination is positive for acute bilateral pulmonary emboli. Evaluation is technically limited due to soft tissue attenuation from habitus and contrast bolus timing. Filling defects are seen on the right involving the distal main pulmonary artery extending into the upper, middle, and lower lobar pulmonary arteries, with segmental and subsegmental involvement of the lower lobe. Filling defects on the left involve the left lower lobar, upper and lower segmental and scattered subsegmental pulmonary arteries. Thromboembolic burden is moderate. RV to LV ratio is borderline at 1. The heart is normal in size. No aortic dissection. Conventional branching pattern from the aortic arch. No pericardial effusion. Mediastinum/Nodes: Enlarged low right hilar node measuring 19 mm. Left lower paratracheal node measures 12 mm, series 5, image 36. Additional smaller mediastinal lymph nodes that are prominent but not enlarged by size criteria. No visualized thyroid nodule. Decompressed esophagus. Lungs/Pleura: Consolidation in the  right lower lobe abuts the pleura, has surrounding ground-glass opacity and is suspicious for pulmonary infarct. There  is linear atelectasis in the left lower lobe and right middle lobe. Minimal right pleural thickening/trace effusion. The trachea and central bronchi are patent. Upper Abdomen: Enlarged liver with suspected steatosis, partially included. Musculoskeletal: There are no acute or suspicious osseous abnormalities. Review of the MIP images confirms the above findings. IMPRESSION: 1. Examination is positive for acute bilateral pulmonary emboli. Thromboembolic burden is moderate. Borderline RV to LV ratio at 1. 2. Right lower lobe consolidation with surrounding ground-glass opacity, suspicious for pulmonary infarct. 3. Enlarged low right hilar and mediastinal lymph nodes are likely reactive. 4. Enlarged liver with suspected steatosis, partially included. Critical Value/emergent results were called by telephone at the time of interpretation on 07/28/2020 at 5:47 pm to provider Messner , who verbally acknowledged these results. Electronically Signed   By: Narda RutherfordMelanie  Sanford M.D.   On: 07/28/2020 17:56   VAS US LOWER EXTREMITY VENOUS (DVT) (ONLY [facility] & [facility])  Result Date: 07/28/2020  Lower Venous DVT Study Indications: Pain, Swelling, and Edema.  Limitations: Poor ultrasound/tissue interface and body habitus. Comparison Study: 07/11/17 previous Performing Technologist: Blanch MediaMegan Riddle RVS  Examination Guidelines: A complete evaluation includes B-mode imaging, spectral Doppler, color Doppler, and power Doppler as needed of all accessible portions of each vessel. Bilateral testing is considered an integral part of a complete examination. Limited examinations for reoccurring indications may be performed as noted. The reflux portion of the exam is performed with the patient in reverse Trendelenburg.  +---------+---------------+---------+-----------+----------+-------------------+ RIGHT    CompressibilityPhasicitySpontaneityPropertiesThrombus Aging      +---------+---------------+---------+-----------+----------+-------------------+  CFV      Full           Yes      Yes                                      +---------+---------------+---------+-----------+----------+-------------------+ SFJ      Full                                                             +---------+---------------+---------+-----------+----------+-------------------+ FV Prox  Full                                                             +---------+---------------+---------+-----------+----------+-------------------+ FV Mid   Full           Yes      Yes                                      +---------+---------------+---------+-----------+----------+-------------------+ FV DistalFull           Yes      Yes                                      +---------+---------------+---------+-----------+----------+-------------------+ PFV      Full                                                             +---------+---------------+---------+-----------+----------+-------------------+  POP      Full           Yes      Yes                                      +---------+---------------+---------+-----------+----------+-------------------+ PTV      Full                                                             +---------+---------------+---------+-----------+----------+-------------------+ PERO                                                  Not well visualized +---------+---------------+---------+-----------+----------+-------------------+   +----+---------------+---------+-----------+----------+--------------+ LEFTCompressibilityPhasicitySpontaneityPropertiesThrombus Aging +----+---------------+---------+-----------+----------+--------------+ CFV Full           Yes      Yes                                 +----+---------------+---------+-----------+----------+--------------+     Summary: RIGHT: - There is no evidence of deep vein thrombosis in the lower extremity.  - No cystic structure found in the  popliteal fossa.  LEFT: - No evidence of common femoral vein obstruction.  *See table(s) above for measurements and observations. Electronically signed by Sherald Hess MD on 07/28/2020 at 4:01:04 PM.    Final

## 2020-07-29 NOTE — Progress Notes (Signed)
Left lower extremity venous study completed.     Please see CV Proc for preliminary results.   Lula Kolton, RVT  

## 2020-07-29 NOTE — Progress Notes (Signed)
  Echocardiogram 2D Echocardiogram with defintiy has been performed.  Leta Jungling M 07/29/2020, 9:54 AM

## 2020-07-29 NOTE — Progress Notes (Signed)
Lab unable  To lab drawn tried x2 and will send someone else to try . Daniel Charles . Aware.

## 2020-07-29 NOTE — Progress Notes (Signed)
HOSPITAL MEDICINE OVERNIGHT EVENT NOTE     Notified by nursing the patient is exhibiting a red MEWS score.  Chart reviewed, patient is currently hospitalized for COVID-19 infection complicated by  pulmonary embolism.  Per my discussion with nursing, patient has exhibited no significant change in symptoms since his arrival from the emergency department.  Patient is continuing to exhibit fevers, tachycardia and tachypnea however this is essentially unchanged compared to his emergency department course.    Patient is hemodynamically stable.  Patient is complaining of chest discomfort which is being managed with as needed Aleve-based analgesics.  Continue current plan of care.  Continuing to monitor patient closely.  Daniel Elk  MD [facility]

## 2020-07-30 DIAGNOSIS — J1282 Pneumonia due to coronavirus disease 2019: Secondary | ICD-10-CM

## 2020-07-30 DIAGNOSIS — R6510 Systemic inflammatory response syndrome (SIRS) of non-infectious origin without acute organ dysfunction: Secondary | ICD-10-CM

## 2020-07-30 DIAGNOSIS — J9601 Acute respiratory failure with hypoxia: Secondary | ICD-10-CM

## 2020-07-30 LAB — COMPREHENSIVE METABOLIC PANEL
ALT: 27 U/L (ref 0–44)
AST: 33 U/L (ref 15–41)
Albumin: 2.4 g/dL — ABNORMAL LOW (ref 3.5–5.0)
Alkaline Phosphatase: 51 U/L (ref 38–126)
Anion gap: 10 (ref 5–15)
BUN: 7 mg/dL (ref 6–20)
CO2: 22 mmol/L (ref 22–32)
Calcium: 8.7 mg/dL — ABNORMAL LOW (ref 8.9–10.3)
Chloride: 101 mmol/L (ref 98–111)
Creatinine, Ser: 0.95 mg/dL (ref 0.61–1.24)
GFR, Estimated: >60 mL/min (ref >60)
Glucose, Bld: 169 mg/dL — ABNORMAL HIGH (ref 70–99)
Potassium: 3.9 mmol/L (ref 3.5–5.1)
Sodium: 133 mmol/L — ABNORMAL LOW (ref 135–145)
Total Bilirubin: 1.8 mg/dL — ABNORMAL HIGH (ref 0.3–1.2)
Total Protein: 7.2 g/dL (ref 6.5–8.1)

## 2020-07-30 LAB — CBC
HCT: 39.9 % (ref 39.0–52.0)
Hemoglobin: 12.4 g/dL — ABNORMAL LOW (ref 13.0–17.0)
MCH: 21.8 pg — ABNORMAL LOW (ref 26.0–34.0)
MCHC: 31.1 g/dL (ref 30.0–36.0)
MCV: 70 fL — ABNORMAL LOW (ref 80.0–100.0)
Platelets: 281 10*3/uL (ref 150–400)
RBC: 5.7 MIL/uL (ref 4.22–5.81)
RDW: 14.9 % (ref 11.5–15.5)
WBC: 24.9 10*3/uL — ABNORMAL HIGH (ref 4.0–10.5)
nRBC: 0 % (ref 0.0–0.2)

## 2020-07-30 LAB — GLUCOSE, CAPILLARY
Glucose-Capillary: 141 mg/dL — ABNORMAL HIGH (ref 70–99)
Glucose-Capillary: 122 mg/dL — ABNORMAL HIGH (ref 70–99)
Glucose-Capillary: 199 mg/dL — ABNORMAL HIGH (ref 70–99)
Glucose-Capillary: 196 mg/dL — ABNORMAL HIGH (ref 70–99)

## 2020-07-30 LAB — HEPARIN LEVEL (UNFRACTIONATED): Heparin Unfractionated: 0.34 IU/mL (ref 0.30–0.70)

## 2020-07-30 LAB — PROCALCITONIN: Procalcitonin: 0.35 ng/mL

## 2020-07-30 LAB — D-DIMER, QUANTITATIVE: D-Dimer, Quant: 1.44 ug/mL-FEU — ABNORMAL HIGH (ref 0.00–0.50)

## 2020-07-30 LAB — C-REACTIVE PROTEIN: CRP: 43.4 mg/dL — ABNORMAL HIGH (ref <1.0)

## 2020-07-30 MED ORDER — APIXABAN 5 MG PO TABS
10.0000 mg | ORAL_TABLET | Freq: Two times a day (BID) | ORAL | Status: DC
  Administered 2020-07-30 – 2020-08-01 (×5): 10 mg via ORAL
  Filled 2020-07-30 (×2): qty 2
  Filled 2020-07-30: qty 4
  Filled 2020-07-30 (×3): qty 2

## 2020-07-30 MED ORDER — APIXABAN (ELIQUIS) EDUCATION KIT FOR DVT/PE PATIENTS
PACK | Freq: Once | Status: AC
  Filled 2020-07-30: qty 1

## 2020-07-30 MED ORDER — APIXABAN 5 MG PO TABS: 5.0000 mg | ORAL_TABLET | Freq: Two times a day (BID) | ORAL | Status: DC

## 2020-07-30 NOTE — TOC Benefit Eligibility Note (Addendum)
Transition of Care [facility] Benefit Eligibility Note    Patient Details  Name: Daniel Charles MRN: [id number] Date of Birth: [birth year]                           ADDITIONAL NOTE: [employer] RX NOT ACTIVE    Mardene Sayer Phone Number: 07/30/2020, 12:44 PM

## 2020-07-30 NOTE — Progress Notes (Signed)
PROGRESS NOTE                                                                                                                                                                                                             Patient Demographics:    Daniel Charles, is a 35 y.o. male, DOB - March 22, 1986, ZCH:[id number]  Outpatient Primary MD for the patient is Patient, No Pcp Per   Admit date - 07/28/2020   LOS - 1  Chief Complaint  Patient presents with  . Leg Pain  . Flank Pain       Brief Narrative: Patient is a 35 y.o. male with no past medical history-presented with right leg pain, chest pain and shortness of breath-he was found to have acute hypoxic respiratory failure due to COVID-19 pneumonia and pulmonary embolism.  COVID-19 vaccinated status: Unvaccinated  Significant Events: 1/31>> Admit to [facility] for hypoxia due to PE/COVID-19 pneumonia  Significant studies: 1/31>> CTA chest: Acute bilateral pulmonary emboli, right lower lobe consolidation with groundglass opacity, enlarged right hilar and mediastinal lymphadenopathy, hepatic steatosis. 2/1>> Echo: EF 60-65%, RV poorly evaluated but not dilated. 1/31>> right lower extremity Doppler: No DVT 2/1>> left lower extremity Doppler: No DVT  COVID-19 medications: Steroids: 2/1>> Remdesivir: 2/1>>  Antibiotics: None  Microbiology data: None  Procedures: None  Consults: None  DVT prophylaxis:  IV heparin    Subjective:    Eliakim Mcnerney today feels better-he was titrated off oxygen-main complaint is ongoing right leg pain.   Assessment  & Plan :   Acute Hypoxic Resp Failure due to pulmonary embolism and possibly due to COVID-19 pneumonia/lung infarct: Suspect primary cause of hypoxemia is due to PE-he may have some contribution from COVID-19 pneumonia and pulmonary embolism.  Transition from IV heparin to Eliquis today.  Have asked RN to get him  out of bed-and ambulate to see how he does.  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  Fever: afebrile O2 requirements:  SpO2: 97 % O2 Flow Rate (L/min): 2 L/min   COVID-19 Labs: Recent Labs    07/29/20 0801 07/30/20 0138  DDIMER 1.53* 1.44*  CRP 35.7* 43.4*       Component Value Date/Time   BNP 35.8 07/29/2020 0801  Recent Labs  Lab 07/29/20 0801 07/30/20 0138  PROCALCITON 0.29 0.35    Lab Results  Component Value Date   SARSCOV2NAA POSITIVE (A) 07/28/2020     Prone/Incentive Spirometry: encouraged patient to lie prone for 3-4 hours at a time for a total of 16 hours a day, and to encourage incentive spirometry use 3-4/hour.  COVID-19 infection with pneumonia: Suspect significantly elevated inflammatory markers is from lung infarction-probably has some mild pneumonitis due to COVID-19.  On steroid/Remdesivir.  Right lower extremity pain: Doppler negative for DVT-benign exam-supportive care.  Morbid Obesity: Estimated body mass index is 50.16 kg/m as calculated from the following:   Height as of this encounter: 6\' 2"  (1.88 m).   Weight as of this encounter: 177.2 kg.     ABG: No results found for: PHART, PCO2ART, PO2ART, HCO3, TCO2, ACIDBASEDEF, O2SAT  Vent Settings: N/A    Condition - Stable  Family Communication  :  Mother-[phone] left voicemail on 2/2  Code Status :  Full Code  Diet :  Diet Order            Diet vegetarian Room service appropriate? Yes; Fluid consistency: Thin  Diet effective now                  Disposition Plan  :   Status is: Inpatient  Remains inpatient appropriate because:Inpatient level of care appropriate due to severity of illness   Dispo: The patient is from: Home              Anticipated d/c is to: Home              Anticipated d/c date is: 1 day              Patient  currently is not medically stable to d/c.   Difficult to place patient No     Barriers to discharge: Hypoxia requiring O2 supplementation/complete 5 days of IV Remdesivir  Antimicorbials  :    Anti-infectives (From admission, onward)   Start     Dose/Rate Route Frequency Ordered Stop   07/30/20 1000  remdesivir 100 mg in sodium chloride 0.9 % 100 mL IVPB       "Followed by" Linked Group Details   100 mg 200 mL/hr over 30 Minutes Intravenous Daily 07/29/20 0724 08/03/20 0959   07/29/20 0900  remdesivir 200 mg in sodium chloride 0.9% 250 mL IVPB       "Followed by" Linked Group Details   200 mg 580 mL/hr over 30 Minutes Intravenous Once 07/29/20 0724 07/29/20 1215      Inpatient Medications  Scheduled Meds: . methylPREDNISolone (SOLU-MEDROL) injection  60 mg Intravenous Q12H  . pantoprazole  40 mg Oral Daily  . sodium chloride flush  3 mL Intravenous Q12H   Continuous Infusions: . heparin 2,400 Units/hr (07/30/20 0126)  . remdesivir 100 mg in NS 100 mL     PRN Meds:.acetaminophen **OR** acetaminophen, oxyCODONE-acetaminophen **OR** morphine injection, polyethylene glycol   Time Spent in minutes  25  See all Orders from today for further details   Jeoffrey Massed M.D on 07/30/2020 at 10:13 AM  To page go to [email or web address] - use universal password  [facility] -  Office  [phone]    Objective:   Vitals:   07/30/20 0113 07/30/20 0456 07/30/20 0624 07/30/20 0713  BP: 103/67 109/69  125/83  Pulse: 100 85  98  Resp: 18 19  16   Temp: 98.4 F (36.9 C) 98.6 F (37 C)  98.1 F (36.7 C)  TempSrc: Oral Oral  Oral  SpO2: 92% 98%  97%  Weight:   (!) 177.2 kg   Height:        Wt Readings from Last 3 Encounters:  07/30/20 (!) 177.2 kg  09/13/18 (!) 185.5 kg     Intake/Output Summary (Last 24 hours) at 07/30/2020 1013 Last data filed at 07/30/2020 3244 Gross per 24 hour  Intake 1737.24 ml  Output 2400 ml  Net -662.76 ml     Physical Exam Gen Exam:Alert  awake-not in any distress HEENT:atraumatic, normocephalic Chest: B/L clear to auscultation anteriorly CVS:S1S2 regular Abdomen:soft non tender, non distended Extremities:no edema Neurology: Non focal Skin: no rash   Data Review:    CBC Recent Labs  Lab 07/28/20 1134 07/29/20 0414 07/29/20 0801 07/30/20 0138  WBC 18.3* 20.1* 18.5* 24.9*  HGB 14.0 12.5* 12.2* 12.4*  HCT 45.7 39.9 39.8 39.9  PLT 301 249 255 281  MCV 71.6* 70.4* 70.9* 70.0*  MCH 21.9* 22.0* 21.7* 21.8*  MCHC 30.6 31.3 30.7 31.1  RDW 15.8* 15.0 15.3 14.9    Chemistries  Recent Labs  Lab 07/28/20 1134 07/29/20 0414 07/29/20 0801 07/30/20 0138  NA 138 135 134* 133*  K 4.1 3.9 4.0 3.9  CL 102 104 102 101  CO2 25 21* 21* 22  GLUCOSE 116* 108* 99 169*  BUN 7 6 8 7   CREATININE 1.22 1.16 1.06 0.95  CALCIUM 9.1 8.5* 8.7* 8.7*  AST  --  31 29 33  ALT  --  28 25 27   ALKPHOS  --  47 43 51  BILITOT  --  2.9* 2.3* 1.8*   ------------------------------------------------------------------------------------------------------------------ No results for input(s): CHOL, HDL, LDLCALC, TRIG, CHOLHDL, LDLDIRECT in the last 72 hours.  No results found for: HGBA1C ------------------------------------------------------------------------------------------------------------------ No results for input(s): TSH, T4TOTAL, T3FREE, THYROIDAB in the last 72 hours.  Invalid input(s): FREET3 ------------------------------------------------------------------------------------------------------------------ No results for input(s): VITAMINB12, FOLATE, FERRITIN, TIBC, IRON, RETICCTPCT in the last 72 hours.  Coagulation profile No results for input(s): INR, PROTIME in the last 168 hours.  Recent Labs    07/29/20 0801 07/30/20 0138  DDIMER 1.53* 1.44*    Cardiac Enzymes No results for input(s): CKMB, TROPONINI, MYOGLOBIN in the last 168 hours.  Invalid input(s):  CK ------------------------------------------------------------------------------------------------------------------    Component Value Date/Time   BNP 35.8 07/29/2020 0801    Micro Results Recent Results (from the past 240 hour(s))  SARS Coronavirus 2 by RT PCR (hospital order, performed in [facility] hospital lab) Nasopharyngeal Nasopharyngeal Swab     Status: Abnormal   Collection Time: 07/28/20  3:21 PM   Specimen: Nasopharyngeal Swab  Result Value Ref Range Status   SARS Coronavirus 2 POSITIVE (A) NEGATIVE Final    Comment: RESULT CALLED TO, READ BACK BY AND VERIFIED WITH: RN JOSH N 2005 [id number] FCP (NOTE) SARS-CoV-2 target nucleic acids are DETECTED  SARS-CoV-2 RNA is generally detectable in upper respiratory specimens  during the acute phase of infection.  Positive results are indicative  of the presence of the identified virus, but do not rule out bacterial infection or co-infection with other pathogens not detected by the test.  Clinical correlation with patient history and  other diagnostic information is necessary to determine patient infection status.  The expected result is negative.  Fact Sheet for Patients:   BoilerBrush.com.cy   Fact Sheet for Healthcare Providers:   https://pope.com/    This test is not yet approved or cleared by the [place] FDA  and  has been authorized for detection and/or diagnosis of SARS-CoV-2 by FDA under an Emergency Use Authorization (EUA).  This EUA will remain in effect (meaning this test can be  used) for the duration of  the COVID-19 declaration under Section 564(b)(1) of the Act, 21 U.S.C. section 360-bbb-3(b)(1), unless the authorization is terminated or revoked sooner.  Performed at [facility], [address], [place], [place] [address]     Radiology Reports DG Chest 2 View  Result Date: 07/28/2020 CLINICAL DATA:  Shortness of breath EXAM: CHEST - 2 VIEW  COMPARISON:  None. FINDINGS: Low lung volumes. Mild patchy density at the lung bases. No significant pleural effusion. No pneumothorax. Cardiomediastinal contours are within normal limits. IMPRESSION: Mild patchy atelectasis/consolidation at the lung bases. Electronically Signed   By: Guadlupe Spanish M.D.   On: 07/28/2020 12:17   DG Tibia/Fibula Right  Result Date: 07/28/2020 CLINICAL DATA:  posterior tib pain Right leg pain no known injury.  Swelling and bruising. EXAM: RIGHT TIBIA AND FIBULA - 2 VIEW COMPARISON:  None. FINDINGS: The cortical margins of the tibia and fibula are intact. There is no evidence of fracture or other focal bone lesions. Knee and ankle alignment are maintained. Diffuse soft tissue edema throughout the lower leg. No soft tissue air. IMPRESSION: Diffuse soft tissue edema throughout the lower leg. No osseous abnormality. Electronically Signed   By: Narda Rutherford M.D.   On: 07/28/2020 15:55   CT Angio Chest PE W and/or Wo Contrast  Result Date: 07/28/2020 CLINICAL DATA:  PE suspected, high prob Leg pain.  Right flank pain. EXAM: CT ANGIOGRAPHY CHEST WITH CONTRAST TECHNIQUE: Multidetector CT imaging of the chest was performed using the standard protocol during bolus administration of intravenous contrast. Multiplanar CT image reconstructions and MIPs were obtained to evaluate the vascular anatomy. CONTRAST:  60mL OMNIPAQUE IOHEXOL 350 MG/ML SOLN COMPARISON:  Chest radiograph earlier today. FINDINGS: Cardiovascular: Examination is positive for acute bilateral pulmonary emboli. Evaluation is technically limited due to soft tissue attenuation from habitus and contrast bolus timing. Filling defects are seen on the right involving the distal main pulmonary artery extending into the upper, middle, and lower lobar pulmonary arteries, with segmental and subsegmental involvement of the lower lobe. Filling defects on the left involve the left lower lobar, upper and lower segmental and scattered  subsegmental pulmonary arteries. Thromboembolic burden is moderate. RV to LV ratio is borderline at 1. The heart is normal in size. No aortic dissection. Conventional branching pattern from the aortic arch. No pericardial effusion. Mediastinum/Nodes: Enlarged low right hilar node measuring 19 mm. Left lower paratracheal node measures 12 mm, series 5, image 36. Additional smaller mediastinal lymph nodes that are prominent but not enlarged by size criteria. No visualized thyroid nodule. Decompressed esophagus. Lungs/Pleura: Consolidation in the right lower lobe abuts the pleura, has surrounding ground-glass opacity and is suspicious for pulmonary infarct. There is linear atelectasis in the left lower lobe and right middle lobe. Minimal right pleural thickening/trace effusion. The trachea and central bronchi are patent. Upper Abdomen: Enlarged liver with suspected steatosis, partially included. Musculoskeletal: There are no acute or suspicious osseous abnormalities. Review of the MIP images confirms the above findings. IMPRESSION: 1. Examination is positive for acute bilateral pulmonary emboli. Thromboembolic burden is moderate. Borderline RV to LV ratio at 1. 2. Right lower lobe consolidation with surrounding ground-glass opacity, suspicious for pulmonary infarct. 3. Enlarged low right hilar and mediastinal lymph nodes are likely reactive. 4. Enlarged liver with suspected steatosis,  partially included. Critical Value/emergent results were called by telephone at the time of interpretation on 07/28/2020 at 5:47 pm to provider Messner , who verbally acknowledged these results. Electronically Signed   By: Narda Rutherford M.D.   On: 07/28/2020 17:56   ECHOCARDIOGRAM COMPLETE  Result Date: 07/29/2020    ECHOCARDIOGRAM REPORT   Patient Name:   NANA HOSELTON Date of Exam: 07/29/2020 Medical Rec #:  [id number]           Height:       74.0 in Accession #:    [id number]          Weight:       384.5 lb Date of Birth:   19-Jul-1985           BSA:          2.869 m Patient Age:    34 years            BP:           128/71 mmHg Patient Gender: M                   HR:           107 bpm. Exam Location:  Inpatient Procedure: 2D Echo, Color Doppler and Cardiac Doppler Indications:    Pulmonary Embolus 415.19 / I26.99  History:        Patient has no prior history of Echocardiogram examinations.                 COVID 19. Chest pain. Shortness of Breath.  Sonographer:    Leta Jungling RDCS Referring Phys: [id number] Cecille Po MELVIN IMPRESSIONS  1. Left ventricular ejection fraction, by estimation, is 60 to 65%. The left ventricle has normal function. The left ventricle has no regional wall motion abnormalities. Left ventricular diastolic parameters are consistent with Grade I diastolic dysfunction (impaired relaxation).  2. The right ventricle is poorly evaluated, but does not appear to be dilated. Right ventricular systolic function was not well visualized. The right ventricular size is not well visualized. Tricuspid regurgitation signal is inadequate for assessing PA pressure.  3. The mitral valve is normal in structure. No evidence of mitral valve regurgitation. No evidence of mitral stenosis.  4. The aortic valve is normal in structure. Aortic valve regurgitation is not visualized. No aortic stenosis is present.  5. Aortic root diameter at upper limit of normal when body size is taken into consideration. Aortic dilatation noted. There is mild dilatation of the aortic root, measuring 39 mm.  6. The inferior vena cava is normal in size with greater than 50% respiratory variability, suggesting right atrial pressure of 3 mmHg. FINDINGS  Left Ventricle: Left ventricular ejection fraction, by estimation, is 60 to 65%. The left ventricle has normal function. The left ventricle has no regional wall motion abnormalities. The left ventricular internal cavity size was normal in size. There is  borderline concentric left ventricular hypertrophy. Left  ventricular diastolic parameters are consistent with Grade I diastolic dysfunction (impaired relaxation). Normal left ventricular filling pressure. Right Ventricle: The right ventricle is poorly evaluated, but does not appear to be dilated. The right ventricular size is not well visualized. No increase in right ventricular wall thickness. Right ventricular systolic function was not well visualized. Tricuspid regurgitation signal is inadequate for assessing PA pressure. Left Atrium: Left atrial size was normal in size. Right Atrium: Right atrial size was normal in size. Pericardium: There is no evidence of pericardial effusion. Mitral Valve: The  mitral valve is normal in structure. No evidence of mitral valve regurgitation. No evidence of mitral valve stenosis. Tricuspid Valve: The tricuspid valve is normal in structure. Tricuspid valve regurgitation is not demonstrated. No evidence of tricuspid stenosis. Aortic Valve: The aortic valve is normal in structure. Aortic valve regurgitation is not visualized. No aortic stenosis is present. Pulmonic Valve: The pulmonic valve was normal in structure. Pulmonic valve regurgitation is not visualized. No evidence of pulmonic stenosis. Aorta: Aortic root diameter at upper limit of normal when body size is taken into consideration. The aortic root is normal in size and structure and aortic dilatation noted. There is mild dilatation of the aortic root, measuring 39 mm. Venous: The inferior vena cava is normal in size with greater than 50% respiratory variability, suggesting right atrial pressure of 3 mmHg. IAS/Shunts: No atrial level shunt detected by color flow Doppler.  LEFT VENTRICLE PLAX 2D LVIDd:         4.80 cm      Diastology LVIDs:         3.80 cm      LV e' medial:    6.31 cm/s LV PW:         1.13 cm      LV E/e' medial:  8.0 LV IVS:        1.30 cm      LV e' lateral:   6.42 cm/s LVOT diam:     2.00 cm      LV E/e' lateral: 7.9 LV SV:         43 LV SV Index:   15 LVOT  Area:     3.14 cm  LV Volumes (MOD) LV vol d, MOD A2C: 110.0 ml LV vol d, MOD A4C: 149.0 ml LV vol s, MOD A2C: 51.7 ml LV vol s, MOD A4C: 26.2 ml LV SV MOD A2C:     58.3 ml LV SV MOD A4C:     149.0 ml LV SV MOD BP:      91.8 ml LEFT ATRIUM             Index LA diam:        2.90 cm 1.01 cm/m LA Vol (A2C):   20.8 ml 7.25 ml/m LA Vol (A4C):   27.4 ml 9.57 ml/m LA Biplane Vol: 23.7 ml 8.26 ml/m  AORTIC VALVE LVOT Vmax:   110.00 cm/s LVOT Vmean:  68.100 cm/s LVOT VTI:    0.138 m  AORTA Ao Root diam: 3.90 cm Ao Asc diam:  3.30 cm MITRAL VALVE MV Area (PHT): 3.72 cm    SHUNTS MV Decel Time: 204 msec    Systemic VTI:  0.14 m MV E velocity: 50.50 cm/s  Systemic Diam: 2.00 cm MV A velocity: 60.60 cm/s MV E/A ratio:  0.83 Mihai Croitoru MD Electronically signed by Thurmon Fair MD Signature Date/Time: 07/29/2020/10:10:00 AM    Final    VAS [place] LOWER EXTREMITY VENOUS (DVT)  Result Date: 07/29/2020  Lower Venous DVT Study Indications: Pulmonary embolism.  Risk Factors: Confirmed PE. Comparison Study: No previous Left leg duplex. RLE negative Performing Technologist: Clint Guy RVT  Examination Guidelines: A complete evaluation includes B-mode imaging, spectral Doppler, color Doppler, and power Doppler as needed of all accessible portions of each vessel. Bilateral testing is considered an integral part of a complete examination. Limited examinations for reoccurring indications may be performed as noted. The reflux portion of the exam is performed with the patient in reverse Trendelenburg.  +---------+---------------+---------+-----------+----------+--------------+ LEFT  CompressibilityPhasicitySpontaneityPropertiesThrombus Aging +---------+---------------+---------+-----------+----------+--------------+ CFV      Full           Yes      Yes                                 +---------+---------------+---------+-----------+----------+--------------+ SFJ      Full                                                         +---------+---------------+---------+-----------+----------+--------------+ FV Prox  Full                                                        +---------+---------------+---------+-----------+----------+--------------+ FV Mid   Full                                                        +---------+---------------+---------+-----------+----------+--------------+ FV DistalFull                                                        +---------+---------------+---------+-----------+----------+--------------+ PFV      Full                                                        +---------+---------------+---------+-----------+----------+--------------+ POP      Full           Yes      Yes                                 +---------+---------------+---------+-----------+----------+--------------+ PTV      Full                                                        +---------+---------------+---------+-----------+----------+--------------+ PERO     Full                                                        +---------+---------------+---------+-----------+----------+--------------+     Summary: LEFT: - There is no evidence of deep vein thrombosis in the lower extremity.  - No cystic structure found in the popliteal fossa.  *See table(s) above for measurements and observations. Electronically signed by Gretta Began MD on 07/29/2020 at 4:55:04 PM.    Final  VAS [place] LOWER EXTREMITY VENOUS (DVT) (ONLY [facility] & [facility])  Result Date: 07/28/2020  Lower Venous DVT Study Indications: Pain, Swelling, and Edema.  Limitations: Poor ultrasound/tissue interface and body habitus. Comparison Study: 07/11/17 previous Performing Technologist: Blanch Media RVS  Examination Guidelines: A complete evaluation includes B-mode imaging, spectral Doppler, color Doppler, and power Doppler as needed of all accessible portions of each vessel. Bilateral testing is considered an integral part of a  complete examination. Limited examinations for reoccurring indications may be performed as noted. The reflux portion of the exam is performed with the patient in reverse Trendelenburg.  +---------+---------------+---------+-----------+----------+-------------------+ RIGHT    CompressibilityPhasicitySpontaneityPropertiesThrombus Aging      +---------+---------------+---------+-----------+----------+-------------------+ CFV      Full           Yes      Yes                                      +---------+---------------+---------+-----------+----------+-------------------+ SFJ      Full                                                             +---------+---------------+---------+-----------+----------+-------------------+ FV Prox  Full                                                             +---------+---------------+---------+-----------+----------+-------------------+ FV Mid   Full           Yes      Yes                                      +---------+---------------+---------+-----------+----------+-------------------+ FV DistalFull           Yes      Yes                                      +---------+---------------+---------+-----------+----------+-------------------+ PFV      Full                                                             +---------+---------------+---------+-----------+----------+-------------------+ POP      Full           Yes      Yes                                      +---------+---------------+---------+-----------+----------+-------------------+ PTV      Full                                                             +---------+---------------+---------+-----------+----------+-------------------+  PERO                                                  Not well visualized +---------+---------------+---------+-----------+----------+-------------------+    +----+---------------+---------+-----------+----------+--------------+ LEFTCompressibilityPhasicitySpontaneityPropertiesThrombus Aging +----+---------------+---------+-----------+----------+--------------+ CFV Full           Yes      Yes                                 +----+---------------+---------+-----------+----------+--------------+     Summary: RIGHT: - There is no evidence of deep vein thrombosis in the lower extremity.  - No cystic structure found in the popliteal fossa.  LEFT: - No evidence of common femoral vein obstruction.  *See table(s) above for measurements and observations. Electronically signed by Sherald Hesshristopher Clark MD on 07/28/2020 at 4:01:04 PM.    Final

## 2020-07-30 NOTE — Progress Notes (Addendum)
ANTICOAGULATION CONSULT NOTE  Pharmacy Consult for heparin>>apixaban Indication: pulmonary embolus  No Known Allergies  Patient Measurements: Height: 6\' 2"  (188 cm) Weight: (!) 177.2 kg (390 lb 10.5 oz) IBW/kg (Calculated) : 82.2 Heparin Dosing Weight: 126kg  Vital Signs: Temp: 98.1 F (36.7 C) (02/02 0713) Temp Source: Oral (02/02 0713) BP: 125/83 (02/02 0713) Pulse Rate: 98 (02/02 0713)  Labs: Recent Labs    07/28/20 1134 07/28/20 1803 07/28/20 1958 07/29/20 0414 07/29/20 0801 07/29/20 1447 07/29/20 1755 07/30/20 0138  HGB  --   --   --  12.5* 12.2*  --   --  12.4*  HCT  --   --   --  39.9 39.8  --   --  39.9  PLT  --   --   --  249 255  --   --  281  HEPARINUNFRC   < >  --   --  0.32  --  <0.10* 0.18* 0.34  CREATININE  --   --   --  1.16 1.06  --   --  0.95  TROPONINIHS  --  43* 8  --   --   --   --   --    < > = values in this interval not displayed.    Estimated Creatinine Clearance: 184.5 mL/min (by C-G formula based on SCr of 0.95 mg/dL).   Medical History: History reviewed. No pertinent past medical history.   Assessment: 34 YOM presenting with RLL pain, SOB, with bilateral PE on CT angio, doppler negative.   Heparin level now at goal on 2400 units/hr after adjustments overnight. CBC stable.   New orders received to transition patient over to apixaban this morning. Will stop heparin once apixaban is administered. Will ask CM to look into copay.   Goal of Therapy:  Heparin level 0.3-0.7 units/ml Monitor platelets by anticoagulation protocol: Yes   Plan:  Transition to apixaban 10mg  bid x 7 days then 5mg  bid F/up education prior to discharge  09/27/20 PharmD., BCPS Clinical Pharmacist 07/30/2020 10:01 AM

## 2020-07-30 NOTE — Evaluation (Signed)
Physical Therapy Evaluation Patient Details Name: Daniel Charles MRN: [id number] DOB: 13-Nov-1985 Today's Date: 07/30/2020   History of Present Illness  Patient is a 35 y.o. male with no past medical history-presented with right leg pain, chest pain and shortness of breath-he was found to have acute hypoxic respiratory failure due to COVID-19 pneumonia and pulmonary embolism. Pt with R LE pain - exam was negative for DVT but pt reports he was told that blood clot may have started there and moved to lungs.  Clinical Impression   Pt admitted with above diagnosis. Pt was limited due to R LE pain during evaluation.  His U/S was negative for DVT but he does have PE, R LE edema and pain.  Pt did ambulate to bathroom earlier with nursing but declined at this time due to R LE too painful.  Pt did agree to sitting at EOB.  Pt was also found to have + dizziness/lightheadedness.  These occurred with transfers, ambulation earlier, and head turns.  Pt did have 15 point drop in BP with sitting , but declined standing.  Additionally, + nystagmus with side gaze, + dizziness with gaze stabilization/head turns, and mild reaction to R [employer].  He will benefit from further vestibular assessment, further check on orthostatic BP with standing, and mobility assessment as pain allows.  Recommend f/u with habituation exercises and possible further education on R Epley's Maneuvar.   Pt currently with functional limitations due to the deficits listed below (see PT Problem List). Pt will benefit from skilled PT to increase their independence and safety with mobility to allow discharge to the venue listed below.  Pt needs further PT prior to discharge.      Follow Up Recommendations Home health PT;Supervision - Intermittent (Needs further assessment as pt declined standing due to pain - suspect will progress well)    Equipment Recommendations  Other (comment) (Baritric RW; Possible bariatric BSC if unable to do stairs  - needs further assessment.)    Recommendations for Other Services       Precautions / Restrictions Precautions Precautions: Fall Precaution Comments: orthostatic and vestibular      Mobility  Bed Mobility Overal bed mobility: Needs Assistance Bed Mobility: Supine to Sit;Sit to Supine     Supine to sit: Supervision;HOB elevated Sit to supine: Min assist;HOB elevated   General bed mobility comments: increased time due to R LE pain; Assist R LE back to bed    Transfers Overall transfer level: Needs assistance               General transfer comment: Declined standing due to R LE pain and he felt that it would buckle due to pain.  He did ambulate with nursing staff to restroom earlier  Ambulation/Gait                Stairs            Wheelchair Mobility    Modified Rankin (Stroke Patients Only)       Balance Overall balance assessment: Needs assistance   Sitting balance-Leahy Scale: Normal         Standing balance comment: not tested                             Pertinent Vitals/Pain Pain Assessment: 0-10 Pain Score: 6  Pain Location: R leg Pain Descriptors / Indicators: Throbbing Pain Intervention(s): Limited activity within patient's tolerance;Monitored during session;Repositioned (pt declined standing -  felt that would increase pain and leg would buckle)    Home Living Family/patient expects to be discharged to:: Private residence Living Arrangements: Parent Available Help at Discharge: Family;Available PRN/intermittently Type of Home: House Home Access: Level entry     Home Layout: Multi-level;Other (Comment) (bed and bath upstairs; no half bath on 1st floor) Home Equipment: None      Prior Function Level of Independence: Independent         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        Extremity/Trunk Assessment   Upper Extremity Assessment Upper Extremity Assessment: Overall WFL for tasks  assessed    Lower Extremity Assessment Lower Extremity Assessment: RLE deficits/detail RLE Deficits / Details: Limited by pain but did demonstrate ROM WFL and MMT at least 3/5 not further tested    Cervical / Trunk Assessment Cervical / Trunk Assessment: Normal  Communication   Communication: No difficulties  Cognition Arousal/Alertness: Awake/alert Behavior During Therapy: WFL for tasks assessed/performed Overall Cognitive Status: Within Functional Limits for tasks assessed                                 General Comments: Very pleasant, motivated, and making jokes      General Comments  Pt with R LE pain.  Educated on AROM and elevation as able to assist with swelling.    Pt with a flight of stairs at his home - discussed stair training sequencing which pt has been performing, but unable to practice due to R LE pain and COVID precautions.  DIZZINESS ASSESSMENT    Pt reports that he got dizzy/lightheaded/room spinning earlier when he was up with nursing staff.   Orthostatic BP: BP Supine 128/70 BP sitting 113/64 + dizziness that resolved quickly BP Standing - declined due to R LE pain  Vestibular Assessment: + nystagmus with R and L side gaze no dizziness + Dizziness with gaze stabilization, no nystagmus noted but pt closed eyes.  Had him repeat and no dizziness. + Dizziness and mild nystagmus with R [employer] - completed Epley's manuvar.   - L [place]  Pt educated on vertigo and potential multiple causes (orthostatic, BPPV, other).  Educated on habituation exercises and performed Epley's Manuvar on for R posterior canal BPPV.  Pt reports that his body adjust to things quickly - for example with gaze stabilization he only got dizzy initially.  Educated that will f/u with handout and further treatment.    Exercises General Exercises - Lower Extremity Ankle Circles/Pumps: AROM;Right;5 reps;Supine Quad Sets: AROM;Right;5 reps;Supine Heel Slides:  AROM;Right;5 reps;Supine Hip ABduction/ADduction: AROM;5 reps;Right;Supine Straight Leg Raises: AROM;Right;5 reps;Supine   Assessment/Plan    PT Assessment Patient needs continued PT services  PT Problem List Decreased strength;Decreased mobility;Decreased activity tolerance;Cardiopulmonary status limiting activity;Decreased balance;Decreased knowledge of use of DME;Pain       PT Treatment Interventions DME instruction;Therapeutic activities;Gait training;Patient/family education;Therapeutic exercise;Stair training;Balance training;Functional mobility training;Neuromuscular re-education;Other (comment) (Vestibular: Habituation, Epley Manuevar as needed)    PT Goals (Current goals can be found in the Care Plan section)  Acute Rehab PT Goals Patient Stated Goal: return home; decrease pain; return to truck driving PT Goal Formulation: With patient Time For Goal Achievement: 08/13/20 Potential to Achieve Goals: Good Additional Goals Additional Goal #1: Pt will be independent with vestibular exercises to decrease dizziness and improve safety    Frequency Min 3X/week  Barriers to discharge Decreased caregiver support;Inaccessible home environment      Co-evaluation               AM-PAC PT "6 Clicks" Mobility  Outcome Measure Help needed turning from your back to your side while in a flat bed without using bedrails?: A Little Help needed moving from lying on your back to sitting on the side of a flat bed without using bedrails?: A Little Help needed moving to and from a bed to a chair (including a wheelchair)?: A Lot Help needed standing up from a chair using your arms (e.g., wheelchair or bedside chair)?: A Lot Help needed to walk in hospital room?: A Lot Help needed climbing 3-5 steps with a railing? : A Lot 6 Click Score: 14    End of Session   Activity Tolerance: Patient limited by pain Patient left: in bed;with call bell/phone within reach;with bed alarm set Nurse  Communication: Mobility status (possible orthostatic and vestibular) PT Visit Diagnosis: Other abnormalities of gait and mobility (R26.89);Dizziness and giddiness (R42);Pain Pain - Right/Left: Right Pain - part of body: Leg    Time: 1725-1805 PT Time Calculation (min) (ACUTE ONLY): 40 min   Charges:   PT Evaluation $PT Eval Moderate Complexity: 1 Mod PT Treatments $Therapeutic Activity: 8-22 mins $Canalith Rep Proc: 8-22 mins        Anise Salvo, PT Acute Rehab Services Pager [phone] [facility] [phone]    Rayetta Humphrey 07/30/2020, 6:27 PM

## 2020-07-30 NOTE — Progress Notes (Signed)
Spoke with pt via TC- per pt he just receive new insurance card in mail- he believes he has pharmacy benefits- will have family provide info from card when he speaks with them later today- TOC will f/u with pt in am for coverage information so that we can check benefits for Eliquis needs.

## 2020-07-31 LAB — CBC
HCT: 37.2 % — ABNORMAL LOW (ref 39.0–52.0)
Hemoglobin: 12.3 g/dL — ABNORMAL LOW (ref 13.0–17.0)
MCH: 22.4 pg — ABNORMAL LOW (ref 26.0–34.0)
MCHC: 33.1 g/dL (ref 30.0–36.0)
MCV: 67.9 fL — ABNORMAL LOW (ref 80.0–100.0)
Platelets: 331 10*3/uL (ref 150–400)
RBC: 5.48 MIL/uL (ref 4.22–5.81)
RDW: 14.9 % (ref 11.5–15.5)
WBC: 26.3 10*3/uL — ABNORMAL HIGH (ref 4.0–10.5)
nRBC: 0.1 % (ref 0.0–0.2)

## 2020-07-31 LAB — COMPREHENSIVE METABOLIC PANEL
ALT: 37 U/L (ref 0–44)
AST: 52 U/L — ABNORMAL HIGH (ref 15–41)
Albumin: 2.2 g/dL — ABNORMAL LOW (ref 3.5–5.0)
Alkaline Phosphatase: 51 U/L (ref 38–126)
Anion gap: 10 (ref 5–15)
BUN: 11 mg/dL (ref 6–20)
CO2: 22 mmol/L (ref 22–32)
Calcium: 8.7 mg/dL — ABNORMAL LOW (ref 8.9–10.3)
Chloride: 104 mmol/L (ref 98–111)
Creatinine, Ser: 0.93 mg/dL (ref 0.61–1.24)
GFR, Estimated: >60 mL/min (ref >60)
Glucose, Bld: 212 mg/dL — ABNORMAL HIGH (ref 70–99)
Potassium: 4 mmol/L (ref 3.5–5.1)
Sodium: 136 mmol/L (ref 135–145)
Total Bilirubin: 0.8 mg/dL (ref 0.3–1.2)
Total Protein: 7.2 g/dL (ref 6.5–8.1)

## 2020-07-31 LAB — C-REACTIVE PROTEIN: CRP: 26.9 mg/dL — ABNORMAL HIGH (ref <1.0)

## 2020-07-31 LAB — GLUCOSE, CAPILLARY
Glucose-Capillary: 138 mg/dL — ABNORMAL HIGH (ref 70–99)
Glucose-Capillary: 198 mg/dL — ABNORMAL HIGH (ref 70–99)
Glucose-Capillary: 171 mg/dL — ABNORMAL HIGH (ref 70–99)
Glucose-Capillary: 200 mg/dL — ABNORMAL HIGH (ref 70–99)

## 2020-07-31 LAB — PROCALCITONIN: Procalcitonin: 0.28 ng/mL

## 2020-07-31 LAB — D-DIMER, QUANTITATIVE: D-Dimer, Quant: 1.5 ug/mL-FEU — ABNORMAL HIGH (ref 0.00–0.50)

## 2020-07-31 NOTE — Progress Notes (Signed)
Physical Therapy Treatment Patient Details Name: Daniel Charles MRN: [id number] DOB: 04/19/1986 Today's Date: 07/31/2020    History of Present Illness Patient is a 35 y.o. male with no past medical history-presented with right leg pain, chest pain and shortness of breath-he was found to have acute hypoxic respiratory failure due to COVID-19 pneumonia and pulmonary embolism. Pt with R LE pain - exam was negative for DVT but pt reports he was told that blood clot may have started there and moved to lungs.    PT Comments    Patient progressing to ambulation in the room this session.  Still on tip toe on R foot initially, but progressed to foot flat after walking a bit.  Discussed how to traverse steps and his mom to get sister's walker for second floor of home.  Feel he should continue to progress with mobility and encouraged walking in the room with nursing.  PT to follow up.    Follow Up Recommendations  Home health PT;Supervision - Intermittent     Equipment Recommendations  Rolling walker with 5" wheels    Recommendations for Other Services       Precautions / Restrictions Precautions Precautions: Fall    Mobility  Bed Mobility   Bed Mobility: Supine to Sit;Sit to Supine     Supine to sit: Modified independent (Device/Increase time) Sit to supine: Modified independent (Device/Increase time)      Transfers Overall transfer level: Needs assistance Equipment used: Rolling walker (2 wheeled) Transfers: Sit to/from Stand Sit to Stand: Supervision;From elevated surface         General transfer comment: assist for safety and from higher surface; up on toes initially on R foot, progressed to foot flat after ambulating a few feet  Ambulation/Gait Ambulation/Gait assistance: Supervision;Min guard Gait Distance (Feet): 80 Feet (in room back and forth to window) Assistive device: Rolling walker (2 wheeled) Gait Pattern/deviations: Step-to pattern;Decreased stride  length;Wide base of support;Trunk flexed;Decreased dorsiflexion - right;Shuffle     General Gait Details: Initially up on toes on R foot and heavy UE support, then  after walking a little able to get foot flat and slid it on the floor with pain downto 4/10   Stairs             Wheelchair Mobility    Modified Rankin (Stroke Patients Only)       Balance Overall balance assessment: Needs assistance   Sitting balance-Leahy Scale: Good       Standing balance-Leahy Scale: Fair Standing balance comment: can stand without UE support, but RW for ambulation                            Cognition Arousal/Alertness: Awake/alert Behavior During Therapy: WFL for tasks assessed/performed Overall Cognitive Status: Within Functional Limits for tasks assessed                                        Exercises      General Comments General comments (skin integrity, edema, etc.): Educated in stair negotiation as has second floor bedroom at home w/ R rail; discussed sideways with two hands to one rail up with L first, down with R first; also spoke to his mom via phone about getting his sister's walker to keep upstairs so he will not have to transport walker on steps.  Used bed in trendelenberg  to re-check for BPPV, noted no nystagmus with R hall pike and also checked horizontal head roll test.  Patient educated to keep using targeting technique if feels off balance and to keep drinking clear fluids as noted urine dark.      Pertinent Vitals/Pain Pain Score: 7  Pain Location: R leg Pain Descriptors / Indicators: Dull;Aching Pain Intervention(s): Monitored during session;Repositioned    Home Living                      Prior Function            PT Goals (current goals can now be found in the care plan section) Progress towards PT goals: Progressing toward goals    Frequency    Min 3X/week      PT Plan Current plan remains  appropriate;Equipment recommendations need to be updated    Co-evaluation              AM-PAC PT "6 Clicks" Mobility   Outcome Measure  Help needed turning from your back to your side while in a flat bed without using bedrails?: None Help needed moving from lying on your back to sitting on the side of a flat bed without using bedrails?: None Help needed moving to and from a bed to a chair (including a wheelchair)?: None Help needed standing up from a chair using your arms (e.g., wheelchair or bedside chair)?: None Help needed to walk in hospital room?: A Little Help needed climbing 3-5 steps with a railing? : A Little 6 Click Score: 22    End of Session   Activity Tolerance: Patient tolerated treatment well Patient left: in bed;with call bell/phone within reach   PT Visit Diagnosis: Other abnormalities of gait and mobility (R26.89);Dizziness and giddiness (R42);Pain Pain - Right/Left: Right Pain - part of body: Leg     Time: 1130-1210 PT Time Calculation (min) (ACUTE ONLY): 40 min  Charges:  $Gait Training: 8-22 mins $Therapeutic Activity: 8-22 mins $Self Care/Home Management: 8-22                     Sheran Lawless, PT Acute Rehabilitation Services [phone] [phone] 07/31/2020    [name] 07/31/2020, 2:19 PM

## 2020-07-31 NOTE — Progress Notes (Signed)
PROGRESS NOTE                                                                                                                                                                                                             Patient Demographics:    Daniel Charles, is a 35 y.o. male, DOB - 1986-03-08, JOI:[id number]  Outpatient Primary MD for the patient is Patient, No Pcp Per   Admit date - 07/28/2020   LOS - 2  Chief Complaint  Patient presents with  . Leg Pain  . Flank Pain       Brief Narrative: Patient is a 35 y.o. male with no past medical history-presented with right leg pain, chest pain and shortness of breath-he was found to have acute hypoxic respiratory failure due to COVID-19 pneumonia and pulmonary embolism.  COVID-19 vaccinated status: Unvaccinated  Significant Events: 1/31>> Admit to [facility] for hypoxia due to PE/COVID-19 pneumonia  Significant studies: 1/31>> CTA chest: Acute bilateral pulmonary emboli, right lower lobe consolidation with groundglass opacity, enlarged right hilar and mediastinal lymphadenopathy, hepatic steatosis. 2/1>> Echo: EF 60-65%, RV poorly evaluated but not dilated. 1/31>> right lower extremity Doppler: No DVT 2/1>> left lower extremity Doppler: No DVT  COVID-19 medications: Steroids: 2/1>> Remdesivir: 2/1>>  Antibiotics: None  Microbiology data: None  Procedures: None  Consults: None  DVT prophylaxis: Eliquis    Subjective:   Right leg pain much better-off oxygen.   Assessment  & Plan :   Acute Hypoxic Resp Failure due to pulmonary embolism and possibly due to COVID-19 pneumonia/lung infarct: Suspect primary cause of hypoxemia is due to PE-he may have some contribution from COVID-19 pneumonia and pulmonary embolism.  Initially on IV heparin-has been transitioned to Eliquis.  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  Fever: afebrile O2 requirements:  SpO2: 96 % O2 Flow Rate (L/min): 2 L/min   COVID-19 Labs: Recent Labs    07/29/20 0801 07/30/20 0138 07/31/20 0108  DDIMER 1.53* 1.44* 1.50*  CRP 35.7* 43.4* 26.9*       Component Value Date/Time   BNP 35.8 07/29/2020 0801    Recent Labs  Lab 07/29/20 0801 07/30/20 0138 07/31/20 0108  PROCALCITON 0.29 0.35 0.28  Lab Results  Component Value Date   SARSCOV2NAA POSITIVE (A) 07/28/2020     Prone/Incentive Spirometry: encouraged patient to lie prone for 3-4 hours at a time for a total of 16 hours a day, and to encourage incentive spirometry use 3-4/hour.  COVID-19 infection with pneumonia: Suspect significantly elevated inflammatory markers is from lung infarction-probably has some mild pneumonitis due to COVID-19.  On steroid/Remdesivir.  Right lower extremity pain: Doppler negative for DVT-benign exam-supportive care.  Morbid Obesity: Estimated body mass index is 49.82 kg/m as calculated from the following:   Height as of this encounter: 6\' 2"  (1.88 m).   Weight as of this encounter: 176 kg.     ABG: No results found for: PHART, PCO2ART, PO2ART, HCO3, TCO2, ACIDBASEDEF, O2SAT  Vent Settings: N/A    Condition - Stable  Family Communication  :  Mother-Connie-[phone] updated on 2/3  Code Status :  Full Code  Diet :  Diet Order            Diet vegetarian Room service appropriate? Yes; Fluid consistency: Thin  Diet effective now                  Disposition Plan  :   Status is: Inpatient  Remains inpatient appropriate because:Inpatient level of care appropriate due to severity of illness   Dispo: The patient is from: Home              Anticipated d/c is to: Home              Anticipated d/c date is: 1 day              Patient currently is not medically stable to d/c.   Difficult to place  patient No    Barriers to discharge: Hypoxia requiring O2 supplementation/complete 5 days of IV Remdesivir  Antimicorbials  :    Anti-infectives (From admission, onward)   Start     Dose/Rate Route Frequency Ordered Stop   07/30/20 1000  remdesivir 100 mg in sodium chloride 0.9 % 100 mL IVPB       "Followed by" Linked Group Details   100 mg 200 mL/hr over 30 Minutes Intravenous Daily 07/29/20 0724 08/03/20 0959   07/29/20 0900  remdesivir 200 mg in sodium chloride 0.9% 250 mL IVPB       "Followed by" Linked Group Details   200 mg 580 mL/hr over 30 Minutes Intravenous Once 07/29/20 0724 07/29/20 1215      Inpatient Medications  Scheduled Meds: . apixaban  10 mg Oral BID   Followed by  . [START ON 08/06/2020] apixaban  5 mg Oral BID  . methylPREDNISolone (SOLU-MEDROL) injection  60 mg Intravenous Q12H  . pantoprazole  40 mg Oral Daily  . sodium chloride flush  3 mL Intravenous Q12H   Continuous Infusions: . remdesivir 100 mg in NS 100 mL 100 mg (07/31/20 1039)   PRN Meds:.acetaminophen **OR** acetaminophen, oxyCODONE-acetaminophen **OR** morphine injection, polyethylene glycol   Time Spent in minutes  25  See all Orders from today for further details   09/28/20 M.D on 07/31/2020 at 12:17 PM  To page go to [email or web address] - use universal password  [facility] -  Office  [phone]    Objective:   Vitals:   07/30/20 2345 07/31/20 0410 07/31/20 0736 07/31/20 1211  BP: 135/87 121/88 122/78 (!) 144/85  Pulse: 93 95 95 (!) 108  Resp: 20 19 16 19   Temp: 98.4 F (36.9 C) 98.3 F (  36.8 C) 98.1 F (36.7 C) 98 F (36.7 C)  TempSrc: Oral Oral Oral Oral  SpO2: 95% 95% 92% 96%  Weight:  (!) 176 kg    Height:        Wt Readings from Last 3 Encounters:  07/31/20 (!) 176 kg  09/13/18 (!) 185.5 kg     Intake/Output Summary (Last 24 hours) at 07/31/2020 1217 Last data filed at 07/31/2020 1206 Gross per 24 hour  Intake 462.5 ml  Output 1400 ml  Net -937.5  ml     Physical Exam Gen Exam:Alert awake-not in any distress HEENT:atraumatic, normocephalic Chest: B/L clear to auscultation anteriorly CVS:S1S2 regular Abdomen:soft non tender, non distended Extremities:no edema Neurology: Non focal Skin: no rash   Data Review:    CBC Recent Labs  Lab 07/28/20 1134 07/29/20 0414 07/29/20 0801 07/30/20 0138 07/31/20 0108  WBC 18.3* 20.1* 18.5* 24.9* 26.3*  HGB 14.0 12.5* 12.2* 12.4* 12.3*  HCT 45.7 39.9 39.8 39.9 37.2*  PLT 301 249 255 281 331  MCV 71.6* 70.4* 70.9* 70.0* 67.9*  MCH 21.9* 22.0* 21.7* 21.8* 22.4*  MCHC 30.6 31.3 30.7 31.1 33.1  RDW 15.8* 15.0 15.3 14.9 14.9    Chemistries  Recent Labs  Lab 07/28/20 1134 07/29/20 0414 07/29/20 0801 07/30/20 0138 07/31/20 0108  NA 138 135 134* 133* 136  K 4.1 3.9 4.0 3.9 4.0  CL 102 104 102 101 104  CO2 25 21* 21* 22 22  GLUCOSE 116* 108* 99 169* 212*  BUN 7 6 8 7 11   CREATININE 1.22 1.16 1.06 0.95 0.93  CALCIUM 9.1 8.5* 8.7* 8.7* 8.7*  AST  --  31 29 33 52*  ALT  --  28 25 27  37  ALKPHOS  --  47 43 51 51  BILITOT  --  2.9* 2.3* 1.8* 0.8   ------------------------------------------------------------------------------------------------------------------ No results for input(s): CHOL, HDL, LDLCALC, TRIG, CHOLHDL, LDLDIRECT in the last 72 hours.  No results found for: HGBA1C ------------------------------------------------------------------------------------------------------------------ No results for input(s): TSH, T4TOTAL, T3FREE, THYROIDAB in the last 72 hours.  Invalid input(s): FREET3 ------------------------------------------------------------------------------------------------------------------ No results for input(s): VITAMINB12, FOLATE, FERRITIN, TIBC, IRON, RETICCTPCT in the last 72 hours.  Coagulation profile No results for input(s): INR, PROTIME in the last 168 hours.  Recent Labs    07/30/20 0138 07/31/20 0108  DDIMER 1.44* 1.50*    Cardiac  Enzymes No results for input(s): CKMB, TROPONINI, MYOGLOBIN in the last 168 hours.  Invalid input(s): CK ------------------------------------------------------------------------------------------------------------------    Component Value Date/Time   BNP 35.8 07/29/2020 0801    Micro Results Recent Results (from the past 240 hour(s))  SARS Coronavirus 2 by RT PCR (hospital order, performed in [facility] hospital lab) Nasopharyngeal Nasopharyngeal Swab     Status: Abnormal   Collection Time: 07/28/20  3:21 PM   Specimen: Nasopharyngeal Swab  Result Value Ref Range Status   SARS Coronavirus 2 POSITIVE (A) NEGATIVE Final    Comment: RESULT CALLED TO, READ BACK BY AND VERIFIED WITH: RN JOSH N 2005 [id number] FCP (NOTE) SARS-CoV-2 target nucleic acids are DETECTED  SARS-CoV-2 RNA is generally detectable in upper respiratory specimens  during the acute phase of infection.  Positive results are indicative  of the presence of the identified virus, but do not rule out bacterial infection or co-infection with other pathogens not detected by the test.  Clinical correlation with patient history and  other diagnostic information is necessary to determine patient infection status.  The expected result is negative.  Fact Sheet for  Patients:   BoilerBrush.com.cy   Fact Sheet for Healthcare Providers:   https://pope.com/    This test is not yet approved or cleared by the [place] FDA and  has been authorized for detection and/or diagnosis of SARS-CoV-2 by FDA under an Emergency Use Authorization (EUA).  This EUA will remain in effect (meaning this test can be  used) for the duration of  the COVID-19 declaration under Section 564(b)(1) of the Act, 21 U.S.C. section 360-bbb-3(b)(1), unless the authorization is terminated or revoked sooner.  Performed at [facility], [address], [place], [place] [address]     Radiology  Reports DG Chest 2 View  Result Date: 07/28/2020 CLINICAL DATA:  Shortness of breath EXAM: CHEST - 2 VIEW COMPARISON:  None. FINDINGS: Low lung volumes. Mild patchy density at the lung bases. No significant pleural effusion. No pneumothorax. Cardiomediastinal contours are within normal limits. IMPRESSION: Mild patchy atelectasis/consolidation at the lung bases. Electronically Signed   By: Guadlupe Spanish M.D.   On: 07/28/2020 12:17   DG Tibia/Fibula Right  Result Date: 07/28/2020 CLINICAL DATA:  posterior tib pain Right leg pain no known injury.  Swelling and bruising. EXAM: RIGHT TIBIA AND FIBULA - 2 VIEW COMPARISON:  None. FINDINGS: The cortical margins of the tibia and fibula are intact. There is no evidence of fracture or other focal bone lesions. Knee and ankle alignment are maintained. Diffuse soft tissue edema throughout the lower leg. No soft tissue air. IMPRESSION: Diffuse soft tissue edema throughout the lower leg. No osseous abnormality. Electronically Signed   By: Narda Rutherford M.D.   On: 07/28/2020 15:55   CT Angio Chest PE W and/or Wo Contrast  Result Date: 07/28/2020 CLINICAL DATA:  PE suspected, high prob Leg pain.  Right flank pain. EXAM: CT ANGIOGRAPHY CHEST WITH CONTRAST TECHNIQUE: Multidetector CT imaging of the chest was performed using the standard protocol during bolus administration of intravenous contrast. Multiplanar CT image reconstructions and MIPs were obtained to evaluate the vascular anatomy. CONTRAST:  60mL OMNIPAQUE IOHEXOL 350 MG/ML SOLN COMPARISON:  Chest radiograph earlier today. FINDINGS: Cardiovascular: Examination is positive for acute bilateral pulmonary emboli. Evaluation is technically limited due to soft tissue attenuation from habitus and contrast bolus timing. Filling defects are seen on the right involving the distal main pulmonary artery extending into the upper, middle, and lower lobar pulmonary arteries, with segmental and subsegmental involvement of the  lower lobe. Filling defects on the left involve the left lower lobar, upper and lower segmental and scattered subsegmental pulmonary arteries. Thromboembolic burden is moderate. RV to LV ratio is borderline at 1. The heart is normal in size. No aortic dissection. Conventional branching pattern from the aortic arch. No pericardial effusion. Mediastinum/Nodes: Enlarged low right hilar node measuring 19 mm. Left lower paratracheal node measures 12 mm, series 5, image 36. Additional smaller mediastinal lymph nodes that are prominent but not enlarged by size criteria. No visualized thyroid nodule. Decompressed esophagus. Lungs/Pleura: Consolidation in the right lower lobe abuts the pleura, has surrounding ground-glass opacity and is suspicious for pulmonary infarct. There is linear atelectasis in the left lower lobe and right middle lobe. Minimal right pleural thickening/trace effusion. The trachea and central bronchi are patent. Upper Abdomen: Enlarged liver with suspected steatosis, partially included. Musculoskeletal: There are no acute or suspicious osseous abnormalities. Review of the MIP images confirms the above findings. IMPRESSION: 1. Examination is positive for acute bilateral pulmonary emboli. Thromboembolic burden is moderate. Borderline RV to LV ratio at 1. 2.  Right lower lobe consolidation with surrounding ground-glass opacity, suspicious for pulmonary infarct. 3. Enlarged low right hilar and mediastinal lymph nodes are likely reactive. 4. Enlarged liver with suspected steatosis, partially included. Critical Value/emergent results were called by telephone at the time of interpretation on 07/28/2020 at 5:47 pm to provider Messner , who verbally acknowledged these results. Electronically Signed   By: Narda Rutherford M.D.   On: 07/28/2020 17:56   ECHOCARDIOGRAM COMPLETE  Result Date: 07/29/2020    ECHOCARDIOGRAM REPORT   Patient Name:   WAYBURN SHALER Date of Exam: 07/29/2020 Medical Rec #:  [id number]            Height:       74.0 in Accession #:    [id number]          Weight:       384.5 lb Date of Birth:  Feb 08, 1986           BSA:          2.869 m Patient Age:    34 years            BP:           128/71 mmHg Patient Gender: M                   HR:           107 bpm. Exam Location:  Inpatient Procedure: 2D Echo, Color Doppler and Cardiac Doppler Indications:    Pulmonary Embolus 415.19 / I26.99  History:        Patient has no prior history of Echocardiogram examinations.                 COVID 19. Chest pain. Shortness of Breath.  Sonographer:    Leta Jungling RDCS Referring Phys: [id number] Cecille Po MELVIN IMPRESSIONS  1. Left ventricular ejection fraction, by estimation, is 60 to 65%. The left ventricle has normal function. The left ventricle has no regional wall motion abnormalities. Left ventricular diastolic parameters are consistent with Grade I diastolic dysfunction (impaired relaxation).  2. The right ventricle is poorly evaluated, but does not appear to be dilated. Right ventricular systolic function was not well visualized. The right ventricular size is not well visualized. Tricuspid regurgitation signal is inadequate for assessing PA pressure.  3. The mitral valve is normal in structure. No evidence of mitral valve regurgitation. No evidence of mitral stenosis.  4. The aortic valve is normal in structure. Aortic valve regurgitation is not visualized. No aortic stenosis is present.  5. Aortic root diameter at upper limit of normal when body size is taken into consideration. Aortic dilatation noted. There is mild dilatation of the aortic root, measuring 39 mm.  6. The inferior vena cava is normal in size with greater than 50% respiratory variability, suggesting right atrial pressure of 3 mmHg. FINDINGS  Left Ventricle: Left ventricular ejection fraction, by estimation, is 60 to 65%. The left ventricle has normal function. The left ventricle has no regional wall motion abnormalities. The left ventricular  internal cavity size was normal in size. There is  borderline concentric left ventricular hypertrophy. Left ventricular diastolic parameters are consistent with Grade I diastolic dysfunction (impaired relaxation). Normal left ventricular filling pressure. Right Ventricle: The right ventricle is poorly evaluated, but does not appear to be dilated. The right ventricular size is not well visualized. No increase in right ventricular wall thickness. Right ventricular systolic function was not well visualized. Tricuspid regurgitation signal is inadequate for assessing PA  pressure. Left Atrium: Left atrial size was normal in size. Right Atrium: Right atrial size was normal in size. Pericardium: There is no evidence of pericardial effusion. Mitral Valve: The mitral valve is normal in structure. No evidence of mitral valve regurgitation. No evidence of mitral valve stenosis. Tricuspid Valve: The tricuspid valve is normal in structure. Tricuspid valve regurgitation is not demonstrated. No evidence of tricuspid stenosis. Aortic Valve: The aortic valve is normal in structure. Aortic valve regurgitation is not visualized. No aortic stenosis is present. Pulmonic Valve: The pulmonic valve was normal in structure. Pulmonic valve regurgitation is not visualized. No evidence of pulmonic stenosis. Aorta: Aortic root diameter at upper limit of normal when body size is taken into consideration. The aortic root is normal in size and structure and aortic dilatation noted. There is mild dilatation of the aortic root, measuring 39 mm. Venous: The inferior vena cava is normal in size with greater than 50% respiratory variability, suggesting right atrial pressure of 3 mmHg. IAS/Shunts: No atrial level shunt detected by color flow Doppler.  LEFT VENTRICLE PLAX 2D LVIDd:         4.80 cm      Diastology LVIDs:         3.80 cm      LV e' medial:    6.31 cm/s LV PW:         1.13 cm      LV E/e' medial:  8.0 LV IVS:        1.30 cm      LV e'  lateral:   6.42 cm/s LVOT diam:     2.00 cm      LV E/e' lateral: 7.9 LV SV:         43 LV SV Index:   15 LVOT Area:     3.14 cm  LV Volumes (MOD) LV vol d, MOD A2C: 110.0 ml LV vol d, MOD A4C: 149.0 ml LV vol s, MOD A2C: 51.7 ml LV vol s, MOD A4C: 26.2 ml LV SV MOD A2C:     58.3 ml LV SV MOD A4C:     149.0 ml LV SV MOD BP:      91.8 ml LEFT ATRIUM             Index LA diam:        2.90 cm 1.01 cm/m LA Vol (A2C):   20.8 ml 7.25 ml/m LA Vol (A4C):   27.4 ml 9.57 ml/m LA Biplane Vol: 23.7 ml 8.26 ml/m  AORTIC VALVE LVOT Vmax:   110.00 cm/s LVOT Vmean:  68.100 cm/s LVOT VTI:    0.138 m  AORTA Ao Root diam: 3.90 cm Ao Asc diam:  3.30 cm MITRAL VALVE MV Area (PHT): 3.72 cm    SHUNTS MV Decel Time: 204 msec    Systemic VTI:  0.14 m MV E velocity: 50.50 cm/s  Systemic Diam: 2.00 cm MV A velocity: 60.60 cm/s MV E/A ratio:  0.83 Mihai Croitoru MD Electronically signed by Thurmon Fair MD Signature Date/Time: 07/29/2020/10:10:00 AM    Final    VAS [place] LOWER EXTREMITY VENOUS (DVT)  Result Date: 07/29/2020  Lower Venous DVT Study Indications: Pulmonary embolism.  Risk Factors: Confirmed PE. Comparison Study: No previous Left leg duplex. RLE negative Performing Technologist: Clint Guy RVT  Examination Guidelines: A complete evaluation includes B-mode imaging, spectral Doppler, color Doppler, and power Doppler as needed of all accessible portions of each vessel. Bilateral testing is considered an integral part of a complete  examination. Limited examinations for reoccurring indications may be performed as noted. The reflux portion of the exam is performed with the patient in reverse Trendelenburg.  +---------+---------------+---------+-----------+----------+--------------+ LEFT     CompressibilityPhasicitySpontaneityPropertiesThrombus Aging +---------+---------------+---------+-----------+----------+--------------+ CFV      Full           Yes      Yes                                  +---------+---------------+---------+-----------+----------+--------------+ SFJ      Full                                                        +---------+---------------+---------+-----------+----------+--------------+ FV Prox  Full                                                        +---------+---------------+---------+-----------+----------+--------------+ FV Mid   Full                                                        +---------+---------------+---------+-----------+----------+--------------+ FV DistalFull                                                        +---------+---------------+---------+-----------+----------+--------------+ PFV      Full                                                        +---------+---------------+---------+-----------+----------+--------------+ POP      Full           Yes      Yes                                 +---------+---------------+---------+-----------+----------+--------------+ PTV      Full                                                        +---------+---------------+---------+-----------+----------+--------------+ PERO     Full                                                        +---------+---------------+---------+-----------+----------+--------------+     Summary: LEFT: - There is no evidence of deep vein thrombosis in the lower extremity.  -  No cystic structure found in the popliteal fossa.  *See table(s) above for measurements and observations. Electronically signed by Gretta Began MD on 07/29/2020 at 4:55:04 PM.    Final    VAS [place] LOWER EXTREMITY VENOUS (DVT) (ONLY [facility] & [facility])  Result Date: 07/28/2020  Lower Venous DVT Study Indications: Pain, Swelling, and Edema.  Limitations: Poor ultrasound/tissue interface and body habitus. Comparison Study: 07/11/17 previous Performing Technologist: Blanch Media RVS  Examination Guidelines: A complete evaluation includes B-mode imaging, spectral Doppler,  color Doppler, and power Doppler as needed of all accessible portions of each vessel. Bilateral testing is considered an integral part of a complete examination. Limited examinations for reoccurring indications may be performed as noted. The reflux portion of the exam is performed with the patient in reverse Trendelenburg.  +---------+---------------+---------+-----------+----------+-------------------+ RIGHT    CompressibilityPhasicitySpontaneityPropertiesThrombus Aging      +---------+---------------+---------+-----------+----------+-------------------+ CFV      Full           Yes      Yes                                      +---------+---------------+---------+-----------+----------+-------------------+ SFJ      Full                                                             +---------+---------------+---------+-----------+----------+-------------------+ FV Prox  Full                                                             +---------+---------------+---------+-----------+----------+-------------------+ FV Mid   Full           Yes      Yes                                      +---------+---------------+---------+-----------+----------+-------------------+ FV DistalFull           Yes      Yes                                      +---------+---------------+---------+-----------+----------+-------------------+ PFV      Full                                                             +---------+---------------+---------+-----------+----------+-------------------+ POP      Full           Yes      Yes                                      +---------+---------------+---------+-----------+----------+-------------------+ PTV      Full                                                             +---------+---------------+---------+-----------+----------+-------------------+  PERO                                                  Not well visualized  +---------+---------------+---------+-----------+----------+-------------------+   +----+---------------+---------+-----------+----------+--------------+ LEFTCompressibilityPhasicitySpontaneityPropertiesThrombus Aging +----+---------------+---------+-----------+----------+--------------+ CFV Full           Yes      Yes                                 +----+---------------+---------+-----------+----------+--------------+     Summary: RIGHT: - There is no evidence of deep vein thrombosis in the lower extremity.  - No cystic structure found in the popliteal fossa.  LEFT: - No evidence of common femoral vein obstruction.  *See table(s) above for measurements and observations. Electronically signed by Sherald Hess MD on 07/28/2020 at 4:01:04 PM.    Final

## 2020-07-31 NOTE — TOC Benefit Eligibility Note (Signed)
Transition of Care [facility]) Benefit Eligibility Note    Patient Details  Name: Daviyon Widmayer MRN: [id number] Date of Birth: 09-Dec-1985   Medication/Dose: ELIQUIS  5 MG BID  Covered?: Yes  Tier: 2 Drug  Prescription Coverage Preferred Pharmacy: [facility]  and [facility]  Spoke with Person/Company/Phone Number:: JOANN @ [employer] # [phone]  Co-Pay: 50 % OF TOTAL COST FOR 30 DAY SUPPLY  MAXIUM OF $75.00  Prior Approval: No  Deductible: Unmet  Additional Notes: [facility] : NOT A PREFERRED  PHARMACY    Mardene Sayer Phone Number: 07/31/2020, 2:14 PM

## 2020-07-31 NOTE — Progress Notes (Signed)
Follow up done with patient over phone regarding insurance coverage- per pt his new card is still with [employer]- confirmed member ID is correct in epic. Per pt his group ID is [id number], and BIN # [id number], toll free # to pharmacy line is [phone]. Will re-submit for benefit check.

## 2020-07-31 NOTE — Discharge Instructions (Addendum)
Person Under Monitoring Name: Daniel Charles  Location: [address] [name]   Infection Prevention Recommendations for Individuals Confirmed to have, or Being Evaluated for, 2019 Novel Coronavirus (COVID-19) Infection Who Receive Care at Home  Individuals who are confirmed to have, or are being evaluated for, COVID-19 should follow the prevention steps below until a healthcare provider or local or state health department says they can return to normal activities.  Stay home except to get medical care You should restrict activities outside your home, except for getting medical care. Do not go to work, school, or public areas, and do not use public transportation or taxis.  Call ahead before visiting your doctor Before your medical appointment, call the healthcare provider and tell them that you have, or are being evaluated for, COVID-19 infection. This will help the healthcare providers office take steps to keep other people from getting infected. Ask your healthcare provider to call the local or state health department.  Monitor your symptoms Seek prompt medical attention if your illness is worsening (e.g., difficulty breathing). Before going to your medical appointment, call the healthcare provider and tell them that you have, or are being evaluated for, COVID-19 infection. Ask your healthcare provider to call the local or state health department.  Wear a facemask You should wear a facemask that covers your nose and mouth when you are in the same room with other people and when you visit a healthcare provider. People who live with or visit you should also wear a facemask while they are in the same room with you.  Separate yourself from other people in your home As much as possible, you should stay in a different room from other people in your home. Also, you should use a separate bathroom, if available.  Avoid sharing household items You  should not share dishes, drinking glasses, cups, eating utensils, towels, bedding, or other items with other people in your home. After using these items, you should wash them thoroughly with soap and water.  Cover your coughs and sneezes Cover your mouth and nose with a tissue when you cough or sneeze, or you can cough or sneeze into your sleeve. Throw used tissues in a lined trash can, and immediately wash your hands with soap and water for at least 20 seconds or use an alcohol-based hand rub.  Wash your [employer] your hands often and thoroughly with soap and water for at least 20 seconds. You can use an alcohol-based hand sanitizer if soap and water are not available and if your hands are not visibly dirty. Avoid touching your eyes, nose, and mouth with unwashed hands.   Prevention Steps for Caregivers and Household Members of Individuals Confirmed to have, or Being Evaluated for, COVID-19 Infection Being Cared for in the Home  If you live with, or provide care at home for, a person confirmed to have, or being evaluated for, COVID-19 infection please follow these guidelines to prevent infection:  Follow healthcare providers instructions Make sure that you understand and can help the patient follow any healthcare provider instructions for all care.  Provide for the patients basic needs You should help the patient with basic needs in the home and provide support for getting groceries, prescriptions, and other personal needs.  Monitor the patients symptoms If they are getting sicker, call his or her medical provider and tell them that the patient has, or is being evaluated for, COVID-19 infection. This will help the healthcare  providers office take steps to keep other people from getting infected. Ask the healthcare provider to call the local or state health department.  Limit the number of people who have contact with the patient  If possible, have only one caregiver for the  patient.  Other household members should stay in another home or place of residence. If this is not possible, they should stay  in another room, or be separated from the patient as much as possible. Use a separate bathroom, if available.  Restrict visitors who do not have an essential need to be in the home.  Keep older adults, very young children, and other sick people away from the patient Keep older adults, very young children, and those who have compromised immune systems or chronic health conditions away from the patient. This includes people with chronic heart, lung, or kidney conditions, diabetes, and cancer.  Ensure good ventilation Make sure that shared spaces in the home have good air flow, such as from an air conditioner or an opened window, weather permitting.  Wash your hands often  Wash your hands often and thoroughly with soap and water for at least 20 seconds. You can use an alcohol based hand sanitizer if soap and water are not available and if your hands are not visibly dirty.  Avoid touching your eyes, nose, and mouth with unwashed hands.  Use disposable paper towels to dry your hands. If not available, use dedicated cloth towels and replace them when they become wet.  Wear a facemask and gloves  Wear a disposable facemask at all times in the room and gloves when you touch or have contact with the patients blood, body fluids, and/or secretions or excretions, such as sweat, saliva, sputum, nasal mucus, vomit, urine, or feces.  Ensure the mask fits over your nose and mouth tightly, and do not touch it during use.  Throw out disposable facemasks and gloves after using them. Do not reuse.  Wash your hands immediately after removing your facemask and gloves.  If your personal clothing becomes contaminated, carefully remove clothing and launder. Wash your hands after handling contaminated clothing.  Place all used disposable facemasks, gloves, and other waste in a lined  container before disposing them with other household waste.  Remove gloves and wash your hands immediately after handling these items.  Do not share dishes, glasses, or other household items with the patient  Avoid sharing household items. You should not share dishes, drinking glasses, cups, eating utensils, towels, bedding, or other items with a patient who is confirmed to have, or being evaluated for, COVID-19 infection.  After the person uses these items, you should wash them thoroughly with soap and water.  Wash laundry thoroughly  Immediately remove and wash clothes or bedding that have blood, body fluids, and/or secretions or excretions, such as sweat, saliva, sputum, nasal mucus, vomit, urine, or feces, on them.  Wear gloves when handling laundry from the patient.  Read and follow directions on labels of laundry or clothing items and detergent. In general, wash and dry with the warmest temperatures recommended on the label.  Clean all areas the individual has used often  Clean all touchable surfaces, such as counters, tabletops, doorknobs, bathroom fixtures, toilets, phones, keyboards, tablets, and bedside tables, every day. Also, clean any surfaces that may have blood, body fluids, and/or secretions or excretions on them.  Wear gloves when cleaning surfaces the patient has come in contact with.  Use a diluted bleach solution (e.g., dilute bleach with  1 part bleach and 10 parts water) or a household disinfectant with a label that says EPA-registered for coronaviruses. To make a bleach solution at home, add 1 tablespoon of bleach to 1 quart (4 cups) of water. For a larger supply, add  cup of bleach to 1 gallon (16 cups) of water.  Read labels of cleaning products and follow recommendations provided on product labels. Labels contain instructions for safe and effective use of the cleaning product including precautions you should take when applying the product, such as wearing gloves or  eye protection and making sure you have good ventilation during use of the product.  Remove gloves and wash hands immediately after cleaning.  Monitor yourself for signs and symptoms of illness Caregivers and household members are considered close contacts, should monitor their health, and will be asked to limit movement outside of the home to the extent possible. Follow the monitoring steps for close contacts listed on the symptom monitoring form.   ? If you have additional questions, contact your local health department or call the epidemiologist on call at [phone] (available 24/7). ? This guidance is subject to change. For the most up-to-date guidance from [facility], please refer to their website: TripMetro.hu    Information on my medicine - ELIQUIS (apixaban)  This medication education was reviewed with me or my healthcare representative as part of my discharge preparation.  The pharmacist that spoke with me during my hospital stay was:  Severiano Gilbert, [facility]  Why was Eliquis prescribed for you? 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  What do You need to know about Eliquis ? The starting dose is 10 mg (two 5 mg tablets) taken TWICE daily for the FIRST SEVEN (7) DAYS, then on (enter date)  08/06/20  the dose is reduced to ONE 5 mg tablet taken TWICE daily.  Eliquis may be taken with or without food.   Try to take the dose about the same time in the morning and in the evening. If you have difficulty swallowing the tablet whole please discuss with your pharmacist how to take the medication safely.  Take Eliquis exactly as prescribed and DO NOT stop taking Eliquis without talking to the doctor who prescribed the medication.  Stopping may increase your risk  of developing a new blood clot.  Refill your prescription before you run out.  After discharge, you should have regular check-up appointments with your healthcare provider that is prescribing your Eliquis.    What do you do if you miss a dose? If a dose of ELIQUIS is not taken at the scheduled time, take it as soon as possible on the same day and twice-daily administration should be resumed. The dose should not be doubled to make up for a missed dose.  Important Safety Information A possible side effect of Eliquis is bleeding. You should call your healthcare provider right away if you experience any of the following: ? Bleeding from an injury or your nose that does not stop. ? Unusual colored urine (red or dark brown) or unusual colored stools (red or black). ? Unusual bruising for unknown reasons. ? A serious fall or if you hit your head (even if there is no bleeding).  Some medicines may interact with Eliquis and might increase your risk of bleeding or clotting while on Eliquis. To  help avoid this, consult your healthcare provider or pharmacist prior to using any new prescription or non-prescription medications, including herbals, vitamins, non-steroidal anti-inflammatory drugs (NSAIDs) and supplements.  This website has more information on Eliquis (apixaban): http://www.eliquis.com/eliquis/home

## 2020-08-01 ENCOUNTER — Other Ambulatory Visit (HOSPITAL_COMMUNITY): Payer: Self-pay | Admitting: Internal Medicine

## 2020-08-01 DIAGNOSIS — J9602 Acute respiratory failure with hypercapnia: Secondary | ICD-10-CM

## 2020-08-01 LAB — COMPREHENSIVE METABOLIC PANEL
ALT: 70 U/L — ABNORMAL HIGH (ref 0–44)
AST: 61 U/L — ABNORMAL HIGH (ref 15–41)
Albumin: 2.1 g/dL — ABNORMAL LOW (ref 3.5–5.0)
Alkaline Phosphatase: 60 U/L (ref 38–126)
Anion gap: 11 (ref 5–15)
BUN: 11 mg/dL (ref 6–20)
CO2: 23 mmol/L (ref 22–32)
Calcium: 8.4 mg/dL — ABNORMAL LOW (ref 8.9–10.3)
Chloride: 103 mmol/L (ref 98–111)
Creatinine, Ser: 0.86 mg/dL (ref 0.61–1.24)
GFR, Estimated: >60 mL/min (ref >60)
Glucose, Bld: 195 mg/dL — ABNORMAL HIGH (ref 70–99)
Potassium: 4.1 mmol/L (ref 3.5–5.1)
Sodium: 137 mmol/L (ref 135–145)
Total Bilirubin: 0.7 mg/dL (ref 0.3–1.2)
Total Protein: 6.5 g/dL (ref 6.5–8.1)

## 2020-08-01 LAB — CBC
HCT: 36.3 % — ABNORMAL LOW (ref 39.0–52.0)
Hemoglobin: 12.1 g/dL — ABNORMAL LOW (ref 13.0–17.0)
MCH: 22.5 pg — ABNORMAL LOW (ref 26.0–34.0)
MCHC: 33.3 g/dL (ref 30.0–36.0)
MCV: 67.6 fL — ABNORMAL LOW (ref 80.0–100.0)
Platelets: 331 10*3/uL (ref 150–400)
RBC: 5.37 MIL/uL (ref 4.22–5.81)
RDW: 14.8 % (ref 11.5–15.5)
WBC: 23.1 10*3/uL — ABNORMAL HIGH (ref 4.0–10.5)
nRBC: 0 % (ref 0.0–0.2)

## 2020-08-01 LAB — D-DIMER, QUANTITATIVE: D-Dimer, Quant: 1.65 ug/mL-FEU — ABNORMAL HIGH (ref 0.00–0.50)

## 2020-08-01 LAB — GLUCOSE, CAPILLARY: Glucose-Capillary: 203 mg/dL — ABNORMAL HIGH (ref 70–99)

## 2020-08-01 LAB — C-REACTIVE PROTEIN: CRP: 9.8 mg/dL — ABNORMAL HIGH (ref <1.0)

## 2020-08-01 MED ORDER — APIXABAN (ELIQUIS) VTE STARTER PACK (10MG AND 5MG): ORAL_TABLET | ORAL | 0 refills | Status: DC

## 2020-08-01 MED ORDER — PREDNISONE 10 MG PO TABS: ORAL_TABLET | ORAL | 0 refills | Status: DC

## 2020-08-01 MED ORDER — OXYCODONE-ACETAMINOPHEN 5-325 MG PO TABS: 1.0000 | ORAL_TABLET | Freq: Three times a day (TID) | ORAL | 0 refills | Status: DC | PRN

## 2020-08-01 MED ORDER — BENZONATATE 100 MG PO CAPS: 100.0000 mg | ORAL_CAPSULE | Freq: Three times a day (TID) | ORAL | 0 refills | Status: DC | PRN

## 2020-08-01 MED FILL — ELIQUIS STARTER PACK 5 MG T: 5 | 30 days supply | Qty: 74 | Fill #0

## 2020-08-01 MED FILL — OXYCODONE-APAP 5-325MG: 5-325 | 5 days supply | Qty: 15 | Fill #0

## 2020-08-01 MED FILL — BENZONATATE 100 MG CAPS: 100 | 10 days supply | Qty: 30 | Fill #0

## 2020-08-01 MED FILL — predniSONE 10 MG TABS: 10 | 4 days supply | Qty: 10 | Fill #0

## 2020-08-01 NOTE — TOC Transition Note (Addendum)
Transition of Care (TOC) - CM/SW Discharge Note Donn Pierini RN, BSN Transitions of Care Unit 4E- RN Case Manager See Treatment Team for direct phone #    Patient Details  Name: Daniel Charles MRN: [id number] Date of Birth: 11-Nov-1985  Transition of Care [facility]) CM/SW Contact:  Darrold Span, RN Phone Number: 08/01/2020, 2:46 PM   Clinical Narrative:    Pt stable for transition home today, has been started on Eliquis, benefits check completed estimated copay $75, pt has been informed of cost- TOC to fill 30 day free and other meds prior to discharge. Pt has been set up with [facility] on 2/15  for primary care needs.  CM spoke with pt via phone call to room due to COVID isolation. Per TC conversation pt reports he lives with mom- discussed DME and HH needs and orders- per pt his mom has borrowed a walker for him to use on return home. Pt is agreeable to [facility] as he wants to "get back to normal so he can return workAdvertising copywriter offered for [facility] agency Per CMS guidelines from [employer].gov website with star ratings (copy placed in shadow chart)- pt reports that his mom is currently using HH services- wants to check with her to see what agency she is using- request a return call.   [facility] contacted to cancel order for RW   1245- return call to patient for [facility] name- per pt his mom is using [facility]- would like to see if they an service him as well- if unable to service him then he does not have any further preference and will defer to this writer to secure an agency on his behalf for [facility] needs.   Call made to [facility] with [facility]- who confirmed mother of patient is active with them- however after checking on insurance for pt- found they are OON with his insurance and unable to accept.   Call made to [facility] with [facility]- referral has been accepted and insurance confirmed. They will contact pt within 48 hr post discharge to set up start of care. TC made back to pt in the room to inform of [facility] agency  arrangements. Pt agreeable to [facility].    Final next level of care: Home w Home Health Services Barriers to Discharge: No Barriers Identified   Patient Goals and CMS Choice Patient states their goals for this hospitalization and ongoing recovery are:: return home and get back to work [employer].gov Compare Post Acute Care list provided to:: Patient Choice offered to / list presented to : Patient  Discharge Placement                Home with [facility]        Discharge Plan and Services   Discharge Planning Services: CM Consult,Follow-up appt scheduled,Medication Assistance Post Acute Care Choice: Home Health,Durable Medical Equipment          DME Arranged: Walker rolling DME Agency: [facility] Date DME Agency Contacted: 08/01/20 Time DME Agency Contacted: 1030 Representative spoke with at DME Agency: Silvio Pate HH Arranged: PT,OT HH Agency: [facility] Date HH Agency Contacted: 08/01/20 Time HH Agency Contacted: 1445 Representative spoke with at [facility] Agency: Pearson Grippe  Social Determinants of Health (SDOH) Interventions     Readmission Risk Interventions Readmission Risk Prevention Plan 08/01/2020  Post Dischage Appt Complete  Medication Screening Complete  Transportation Screening Complete  Some recent data might be hidden

## 2020-08-01 NOTE — Discharge Summary (Signed)
PATIENT DETAILS Name: Daniel Charles Age: 35 y.o. Sex: male Date of Birth: June 05, 1986 MRN: [id number]. Admitting Physician: Synetta Fail, MD WUJ:[id number], No Pcp Per  Admit Date: 07/28/2020 Discharge date: 08/01/2020  Recommendations for Outpatient Follow-up:  1. Follow up with PCP in 1-2 weeks 2. Please obtain CMP/CBC in one week 3. Repeat Chest Xray in 4-6 week 4. Incidental finding of hilar/mediastinal adenopathy-likely reactive-please repeat imaging in 4 to 6 weeks.  If still persists on repeat imaging-consider pulmonary work-up. 5. Incidental finding of hepatic steatosis seen on CT chest-please consider outpatient work-up. 6. 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  Admitted From:  Home  Disposition: Home with home health services   Home Health: No  Equipment/Devices: None  Discharge Condition: Stable  CODE STATUS: FULL CODE  Diet recommendation:  Diet Order            Diet general           Diet vegetarian Room service appropriate? Yes; Fluid consistency: Thin  Diet effective now                  Brief Narrative: Patient is a 35 y.o. male with no past medical history-presented with right leg pain, chest pain and shortness of breath-he was found to have acute hypoxic respiratory failure due to COVID-19 pneumonia and pulmonary embolism.  COVID-19 vaccinated status: Unvaccinated  Significant Events: 1/31>> Admit to [facility] for hypoxia due to PE/COVID-19 pneumonia  Significant studies: 1/31>> CTA chest: Acute bilateral pulmonary emboli, right lower lobe consolidation with groundglass opacity, enlarged right hilar and mediastinal lymphadenopathy, hepatic steatosis. 2/1>> Echo: EF 60-65%, RV poorly evaluated but not dilated. 1/31>> right lower extremity Doppler: No DVT 2/1>> left lower extremity  Doppler: No DVT  COVID-19 medications: Steroids: 2/1>> Remdesivir: 2/1>>  Antibiotics: None  Microbiology data: None  Procedures: None  Consults: None  Brief Hospital Course: Acute Hypoxic Resp Failure due to pulmonary embolism and possibly due to COVID-19 pneumonia/lung infarct: Suspect primary cause of hypoxemia is due to PE-he may have some contribution from COVID-19 pneumonia and pulmonary embolism.  Initially on IV heparin-has been transitioned to Eliquis.  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   COVID-19 Labs:  Recent Labs    07/30/20 0138 07/31/20 0108 08/01/20 0532  DDIMER 1.44* 1.50* 1.65*  CRP 43.4* 26.9* 9.8*    Lab Results  Component Value Date   SARSCOV2NAA POSITIVE (A) 07/28/2020     COVID-19 infection with pneumonia: Suspect significantly elevated inflammatory markers is from lung infarction-probably has some mild pneumonitis due to COVID-19.    CRP downtrending-hypoxia has resolved-much better-we will be on tapering steroids on discharge.    Right lower extremity pain: Doppler negative for DVT-but given body habitus-could have a small DVT not visualized on Doppler.  Exam is benign-pain per patient has gradually improved-he is now able to ambulate.  Continue supportive care and close monitoring in the outpatient setting.  Continue as needed Percocet for few more days on discharge.  Incidental finding of right hilar/mediastinal adenopathy and hepatic steatosis on CT chest: Defer to PCP-needs repeat imaging of CT chest in 4 to 6 weeks.  Morbid Obesity: Estimated  body mass index is 49.82 kg/m as calculated from the following:   Height as of this encounter:  6\' 2"  (1.88 m).   Weight as of this encounter: 176 kg.    Discharge Diagnoses:  Principal Problem:   Pulmonary embolism (HCC) Active Problems:   SIRS (systemic inflammatory response syndrome) [facility])   Discharge Instructions:    Person Under Monitoring Name: Daniel Charles  Location: [address] [place] [address]   Infection Prevention Recommendations for Individuals Confirmed to have, or Being Evaluated for, 2019 Novel Coronavirus (COVID-19) Infection Who Receive Care at Home  Individuals who are confirmed to have, or are being evaluated for, COVID-19 should follow the prevention steps below until a healthcare provider or local or state health department says they can return to normal activities.  Stay home except to get medical care You should restrict activities outside your home, except for getting medical care. Do not go to work, school, or public areas, and do not use public transportation or taxis.  Call ahead before visiting your doctor Before your medical appointment, call the healthcare provider and tell them that you have, or are being evaluated for, COVID-19 infection. This will help the healthcare provider's office take steps to keep other people from getting infected. Ask your healthcare provider to call the local or state health department.  Monitor your symptoms Seek prompt medical attention if your illness is worsening (e.g., difficulty breathing). Before going to your medical appointment, call the healthcare provider and tell them that you have, or are being evaluated for, COVID-19 infection. Ask your healthcare provider to call the local or state health department.  Wear a facemask You should wear a facemask that covers your nose and mouth when you are in the same room with other people and when you visit a healthcare provider. People who live with or visit you should also wear a facemask while they are in the same room with  you.  Separate yourself from other people in your home As much as possible, you should stay in a different room from other people in your home. Also, you should use a separate bathroom, if available.  Avoid sharing household items You should not share dishes, drinking glasses, cups, eating utensils, towels, bedding, or other items with other people in your home. After using these items, you should wash them thoroughly with soap and water.  Cover your coughs and sneezes Cover your mouth and nose with a tissue when you cough or sneeze, or you can cough or sneeze into your sleeve. Throw used tissues in a lined trash can, and immediately wash your hands with soap and water for at least 20 seconds or use an alcohol-based hand rub.  Wash your [employer] your hands often and thoroughly with soap and water for at least 20 seconds. You can use an alcohol-based hand sanitizer if soap and water are not available and if your hands are not visibly dirty. Avoid touching your eyes, nose, and mouth with unwashed hands.   Prevention Steps for Caregivers and Household Members of Individuals Confirmed to have, or Being Evaluated for, COVID-19 Infection Being Cared for in the Home  If you live with, or provide care at home for, a person confirmed to have, or being evaluated for, COVID-19 infection please follow these guidelines to prevent infection:  Follow healthcare provider's instructions Make sure that you understand and can help the patient follow any healthcare provider instructions for all care.  Provide for the  patient's basic needs You should help the patient with basic needs in the home and provide support for getting groceries, prescriptions, and other personal needs.  Monitor the patient's symptoms If they are getting sicker, call his or her medical provider and tell them that the patient has, or is being evaluated for, COVID-19 infection. This will help the healthcare provider's office  take steps to keep other people from getting infected. Ask the healthcare provider to call the local or state health department.  Limit the number of people who have contact with the patient  If possible, have only one caregiver for the patient.  Other household members should stay in another home or place of residence. If this is not possible, they should stay  in another room, or be separated from the patient as much as possible. Use a separate bathroom, if available.  Restrict visitors who do not have an essential need to be in the home.  Keep older adults, very young children, and other sick people away from the patient Keep older adults, very young children, and those who have compromised immune systems or chronic health conditions away from the patient. This includes people with chronic heart, lung, or kidney conditions, diabetes, and cancer.  Ensure good ventilation Make sure that shared spaces in the home have good air flow, such as from an air conditioner or an opened window, weather permitting.  Wash your hands often  Wash your hands often and thoroughly with soap and water for at least 20 seconds. You can use an alcohol based hand sanitizer if soap and water are not available and if your hands are not visibly dirty.  Avoid touching your eyes, nose, and mouth with unwashed hands.  Use disposable paper towels to dry your hands. If not available, use dedicated cloth towels and replace them when they become wet.  Wear a facemask and gloves  Wear a disposable facemask at all times in the room and gloves when you touch or have contact with the patient's blood, body fluids, and/or secretions or excretions, such as sweat, saliva, sputum, nasal mucus, vomit, urine, or feces.  Ensure the mask fits over your nose and mouth tightly, and do not touch it during use.  Throw out disposable facemasks and gloves after using them. Do not reuse.  Wash your hands immediately after removing  your facemask and gloves.  If your personal clothing becomes contaminated, carefully remove clothing and launder. Wash your hands after handling contaminated clothing.  Place all used disposable facemasks, gloves, and other waste in a lined container before disposing them with other household waste.  Remove gloves and wash your hands immediately after handling these items.  Do not share dishes, glasses, or other household items with the patient  Avoid sharing household items. You should not share dishes, drinking glasses, cups, eating utensils, towels, bedding, or other items with a patient who is confirmed to have, or being evaluated for, COVID-19 infection.  After the person uses these items, you should wash them thoroughly with soap and water.  Wash laundry thoroughly  Immediately remove and wash clothes or bedding that have blood, body fluids, and/or secretions or excretions, such as sweat, saliva, sputum, nasal mucus, vomit, urine, or feces, on them.  Wear gloves when handling laundry from the patient.  Read and follow directions on labels of laundry or clothing items and detergent. In general, wash and dry with the warmest temperatures recommended on the label.  Clean all areas the individual has used often  Clean all touchable surfaces, such as counters, tabletops, doorknobs, bathroom fixtures, toilets, phones, keyboards, tablets, and bedside tables, every day. Also, clean any surfaces that may have blood, body fluids, and/or secretions or excretions on them.  Wear gloves when cleaning surfaces the patient has come in contact with.  Use a diluted bleach solution (e.g., dilute bleach with 1 part bleach and 10 parts water) or a household disinfectant with a label that says EPA-registered for coronaviruses. To make a bleach solution at home, add 1 tablespoon of bleach to 1 quart (4 cups) of water. For a larger supply, add  cup of bleach to 1 gallon (16 cups) of water.  Read labels  of cleaning products and follow recommendations provided on product labels. Labels contain instructions for safe and effective use of the cleaning product including precautions you should take when applying the product, such as wearing gloves or eye protection and making sure you have good ventilation during use of the product.  Remove gloves and wash hands immediately after cleaning.  Monitor yourself for signs and symptoms of illness Caregivers and household members are considered close contacts, should monitor their health, and will be asked to limit movement outside of the home to the extent possible. Follow the monitoring steps for close contacts listed on the symptom monitoring form.   ? If you have additional questions, contact your local health department or call the epidemiologist on call at [phone] (available 24/7). ? This guidance is subject to change. For the most up-to-date guidance from CDC, please refer to their website: [employer]    Activity:  As tolerated with Full fall precautions use walker/cane & assistance as needed  Discharge Instructions    Call MD for:  difficulty breathing, headache or visual disturbances   Complete by: As directed    Diet general   Complete by: As directed    Discharge instructions   Complete by: As directed    1.)  21 days of isolation from 1/31  2.)  If you develop worsening shortness of breath-please seek immediate medical attention  3.)  Please ask your primary care practitioner for refills for Eliquis (blood thinner) at your next follow-up.  You will need at least 6 months of uninterrupted treatment with blood thinners.  4.)  You were incidentally found to have enlarged lymph node in your chest-you require a repeat CT scan in 4 to 6 weeks to see if these enlarged lymph nodes have resolved.  Please ask your primary care practitioner to arrange a CT scan of the  chest.  5.)  You have fatty liver that was seen incidentally on CT scan of your chest-please let your primary care practitioner know about this.   Follow with Primary MD  in 1-2 weeks  Please get a complete blood count and chemistry panel checked by your Primary MD at your next visit, and again as instructed by your Primary MD.  Get Medicines reviewed and adjusted: Please take all your medications with you for your next visit with your Primary MD  Laboratory/radiological data: Please request your Primary MD to go over all hospital tests and procedure/radiological results at the follow up, please ask your Primary MD to get all Hospital records sent to his/her office.  In some cases, they will be blood work, cultures and biopsy results pending at the time of your discharge. Please request that your primary care M.D. follows up on these results.  Also Note the following: If you experience worsening of your admission symptoms,  develop shortness of breath, life threatening emergency, suicidal or homicidal thoughts you must seek medical attention immediately by calling 911 or calling your MD immediately  if symptoms less severe.  You must read complete instructions/literature along with all the possible adverse reactions/side effects for all the Medicines you take and that have been prescribed to you. Take any new Medicines after you have completely understood and accpet all the possible adverse reactions/side effects.   Do not drive when taking Pain medications or sleeping medications (Benzodaizepines)  Do not take more than prescribed Pain, Sleep and Anxiety Medications. It is not advisable to combine anxiety,sleep and pain medications without talking with your primary care practitioner  Special Instructions: If you have smoked or chewed Tobacco  in the last 2 yrs please stop smoking, stop any regular Alcohol  and or any Recreational drug use.  Wear Seat belts while driving.  Please  note: You were cared for by a hospitalist during your hospital stay. Once you are discharged, your primary care physician will handle any further medical issues. Please note that NO REFILLS for any discharge medications will be authorized once you are discharged, as it is imperative that you return to your primary care physician (or establish a relationship with a primary care physician if you do not have one) for your post hospital discharge needs so that they can reassess your need for medications and monitor your lab values.   Increase activity slowly   Complete by: As directed      Allergies as of 08/01/2020   No Known Allergies     Medication List    STOP taking these medications   acetaminophen 500 MG tablet Commonly known as: TYLENOL   methocarbamol 500 MG tablet Commonly known as: ROBAXIN   naproxen sodium 220 MG tablet Commonly known as: ALEVE     TAKE these medications   Apixaban Starter Pack (10mg  and 5mg ) Commonly known as: ELIQUIS STARTER PACK Take as directed on package: start with two-5mg  tablets twice daily for 7 days. On day 8, switch to one-5mg  tablet twice daily.   benzonatate 100 MG capsule Commonly known as: Tessalon Perles Take 1 capsule (100 mg total) by mouth 3 (three) times daily as needed for cough.   oxyCODONE-acetaminophen 5-325 MG tablet Commonly known as: Percocet Take 1 tablet by mouth every 8 (eight) hours as needed for severe pain (right leg pain).   predniSONE 10 MG tablet Commonly known as: DELTASONE Take 40 mg daily for 1 day, 30 mg daily for 1 day, 20 mg daily for 1 days,10 mg daily for 1 day, then stop       Follow-up Information    [facility] Follow up on 08/12/2020.   Why: at 9am, this will be a virtual visit via telephone Contact information: [facility] [place] [phone]             No Known Allergies    Other Procedures/Studies: DG Chest 2  View  Result Date: 07/28/2020 CLINICAL DATA:  Shortness of breath EXAM: CHEST - 2 VIEW COMPARISON:  None. FINDINGS: Low lung volumes. Mild patchy density at the lung bases. No significant pleural effusion. No pneumothorax. Cardiomediastinal contours are within normal limits. IMPRESSION: Mild patchy atelectasis/consolidation at the lung bases. Electronically Signed   By: [phone] M.D.   On: 07/28/2020 12:17   DG Tibia/Fibula Right  Result Date: 07/28/2020 CLINICAL DATA:  posterior tib pain Right leg pain no known  injury.  Swelling and bruising. EXAM: RIGHT TIBIA AND FIBULA - 2 VIEW COMPARISON:  None. FINDINGS: The cortical margins of the tibia and fibula are intact. There is no evidence of fracture or other focal bone lesions. Knee and ankle alignment are maintained. Diffuse soft tissue edema throughout the lower leg. No soft tissue air. IMPRESSION: Diffuse soft tissue edema throughout the lower leg. No osseous abnormality. Electronically Signed   By: Narda Rutherford M.D.   On: 07/28/2020 15:55   CT Angio Chest PE W and/or Wo Contrast  Result Date: 07/28/2020 CLINICAL DATA:  PE suspected, high prob Leg pain.  Right flank pain. EXAM: CT ANGIOGRAPHY CHEST WITH CONTRAST TECHNIQUE: Multidetector CT imaging of the chest was performed using the standard protocol during bolus administration of intravenous contrast. Multiplanar CT image reconstructions and MIPs were obtained to evaluate the vascular anatomy. CONTRAST:  60mL OMNIPAQUE IOHEXOL 350 MG/ML SOLN COMPARISON:  Chest radiograph earlier today. FINDINGS: Cardiovascular: Examination is positive for acute bilateral pulmonary emboli. Evaluation is technically limited due to soft tissue attenuation from habitus and contrast bolus timing. Filling defects are seen on the right involving the distal main pulmonary artery extending into the upper, middle, and lower lobar pulmonary arteries, with segmental and subsegmental involvement of the lower lobe. Filling  defects on the left involve the left lower lobar, upper and lower segmental and scattered subsegmental pulmonary arteries. Thromboembolic burden is moderate. RV to LV ratio is borderline at 1. The heart is normal in size. No aortic dissection. Conventional branching pattern from the aortic arch. No pericardial effusion. Mediastinum/Nodes: Enlarged low right hilar node measuring 19 mm. Left lower paratracheal node measures 12 mm, series 5, image 36. Additional smaller mediastinal lymph nodes that are prominent but not enlarged by size criteria. No visualized thyroid nodule. Decompressed esophagus. Lungs/Pleura: Consolidation in the right lower lobe abuts the pleura, has surrounding ground-glass opacity and is suspicious for pulmonary infarct. There is linear atelectasis in the left lower lobe and right middle lobe. Minimal right pleural thickening/trace effusion. The trachea and central bronchi are patent. Upper Abdomen: Enlarged liver with suspected steatosis, partially included. Musculoskeletal: There are no acute or suspicious osseous abnormalities. Review of the MIP images confirms the above findings. IMPRESSION: 1. Examination is positive for acute bilateral pulmonary emboli. Thromboembolic burden is moderate. Borderline RV to LV ratio at 1. 2. Right lower lobe consolidation with surrounding ground-glass opacity, suspicious for pulmonary infarct. 3. Enlarged low right hilar and mediastinal lymph nodes are likely reactive. 4. Enlarged liver with suspected steatosis, partially included. Critical Value/emergent results were called by telephone at the time of interpretation on 07/28/2020 at 5:47 pm to provider Messner , who verbally acknowledged these results. Electronically Signed   By: Narda Rutherford M.D.   On: 07/28/2020 17:56   ECHOCARDIOGRAM COMPLETE  Result Date: 07/29/2020    ECHOCARDIOGRAM REPORT   Patient Name:   Daniel Charles Date of Exam: 07/29/2020 Medical Rec #:  [id number]           Height:        74.0 in Accession #:    [id number]          Weight:       384.5 lb Date of Birth:  [birth year]           BSA:          2.869 m Patient Age:    34 years            BP:  128/71 mmHg Patient Gender: M                   HR:           107 bpm. Exam Location:  Inpatient Procedure: 2D Echo, Color Doppler and Cardiac Doppler Indications:    Pulmonary Embolus 415.19 / I26.99  History:        Patient has no prior history of Echocardiogram examinations.                 COVID 19. Chest pain. Shortness of Breath.  Sonographer:    Leta Junglingiffany Cooper RDCS Referring Phys: [id number] Cecille PoLEXANDER B MELVIN IMPRESSIONS  1. Left ventricular ejection fraction, by estimation, is 60 to 65%. The left ventricle has normal function. The left ventricle has no regional wall motion abnormalities. Left ventricular diastolic parameters are consistent with Grade I diastolic dysfunction (impaired relaxation).  2. The right ventricle is poorly evaluated, but does not appear to be dilated. Right ventricular systolic function was not well visualized. The right ventricular size is not well visualized. Tricuspid regurgitation signal is inadequate for assessing PA pressure.  3. The mitral valve is normal in structure. No evidence of mitral valve regurgitation. No evidence of mitral stenosis.  4. The aortic valve is normal in structure. Aortic valve regurgitation is not visualized. No aortic stenosis is present.  5. Aortic root diameter at upper limit of normal when body size is taken into consideration. Aortic dilatation noted. There is mild dilatation of the aortic root, measuring 39 mm.  6. The inferior vena cava is normal in size with greater than 50% respiratory variability, suggesting right atrial pressure of 3 mmHg. FINDINGS  Left Ventricle: Left ventricular ejection fraction, by estimation, is 60 to 65%. The left ventricle has normal function. The left ventricle has no regional wall motion abnormalities. The left ventricular internal cavity size  was normal in size. There is  borderline concentric left ventricular hypertrophy. Left ventricular diastolic parameters are consistent with Grade I diastolic dysfunction (impaired relaxation). Normal left ventricular filling pressure. Right Ventricle: The right ventricle is poorly evaluated, but does not appear to be dilated. The right ventricular size is not well visualized. No increase in right ventricular wall thickness. Right ventricular systolic function was not well visualized. Tricuspid regurgitation signal is inadequate for assessing PA pressure. Left Atrium: Left atrial size was normal in size. Right Atrium: Right atrial size was normal in size. Pericardium: There is no evidence of pericardial effusion. Mitral Valve: The mitral valve is normal in structure. No evidence of mitral valve regurgitation. No evidence of mitral valve stenosis. Tricuspid Valve: The tricuspid valve is normal in structure. Tricuspid valve regurgitation is not demonstrated. No evidence of tricuspid stenosis. Aortic Valve: The aortic valve is normal in structure. Aortic valve regurgitation is not visualized. No aortic stenosis is present. Pulmonic Valve: The pulmonic valve was normal in structure. Pulmonic valve regurgitation is not visualized. No evidence of pulmonic stenosis. Aorta: Aortic root diameter at upper limit of normal when body size is taken into consideration. The aortic root is normal in size and structure and aortic dilatation noted. There is mild dilatation of the aortic root, measuring 39 mm. Venous: The inferior vena cava is normal in size with greater than 50% respiratory variability, suggesting right atrial pressure of 3 mmHg. IAS/Shunts: No atrial level shunt detected by color flow Doppler.  LEFT VENTRICLE PLAX 2D LVIDd:         4.80 cm      Diastology  LVIDs:         3.80 cm      LV e' medial:    6.31 cm/s LV PW:         1.13 cm      LV E/e' medial:  8.0 LV IVS:        1.30 cm      LV e' lateral:   6.42 cm/s LVOT  diam:     2.00 cm      LV E/e' lateral: 7.9 LV SV:         43 LV SV Index:   15 LVOT Area:     3.14 cm  LV Volumes (MOD) LV vol d, MOD A2C: 110.0 ml LV vol d, MOD A4C: 149.0 ml LV vol s, MOD A2C: 51.7 ml LV vol s, MOD A4C: 26.2 ml LV SV MOD A2C:     58.3 ml LV SV MOD A4C:     149.0 ml LV SV MOD BP:      91.8 ml LEFT ATRIUM             Index LA diam:        2.90 cm 1.01 cm/m LA Vol (A2C):   20.8 ml 7.25 ml/m LA Vol (A4C):   27.4 ml 9.57 ml/m LA Biplane Vol: 23.7 ml 8.26 ml/m  AORTIC VALVE LVOT Vmax:   110.00 cm/s LVOT Vmean:  68.100 cm/s LVOT VTI:    0.138 m  AORTA Ao Root diam: 3.90 cm Ao Asc diam:  3.30 cm MITRAL VALVE MV Area (PHT): 3.72 cm    SHUNTS MV Decel Time: 204 msec    Systemic VTI:  0.14 m MV E velocity: 50.50 cm/s  Systemic Diam: 2.00 cm MV A velocity: 60.60 cm/s MV E/A ratio:  0.83 Mihai Croitoru MD Electronically signed by Thurmon Fair MD Signature Date/Time: 07/29/2020/10:10:00 AM    Final    VAS [place] LOWER EXTREMITY VENOUS (DVT)  Result Date: 07/29/2020  Lower Venous DVT Study Indications: Pulmonary embolism.  Risk Factors: Confirmed PE. Comparison Study: No previous Left leg duplex. RLE negative Performing Technologist: Clint Guy RVT  Examination Guidelines: A complete evaluation includes B-mode imaging, spectral Doppler, color Doppler, and power Doppler as needed of all accessible portions of each vessel. Bilateral testing is considered an integral part of a complete examination. Limited examinations for reoccurring indications may be performed as noted. The reflux portion of the exam is performed with the patient in reverse Trendelenburg.  +---------+---------------+---------+-----------+----------+--------------+ LEFT     CompressibilityPhasicitySpontaneityPropertiesThrombus Aging +---------+---------------+---------+-----------+----------+--------------+ CFV      Full           Yes      Yes                                  +---------+---------------+---------+-----------+----------+--------------+ SFJ      Full                                                        +---------+---------------+---------+-----------+----------+--------------+ FV Prox  Full                                                        +---------+---------------+---------+-----------+----------+--------------+  FV Mid   Full                                                        +---------+---------------+---------+-----------+----------+--------------+ FV DistalFull                                                        +---------+---------------+---------+-----------+----------+--------------+ PFV      Full                                                        +---------+---------------+---------+-----------+----------+--------------+ POP      Full           Yes      Yes                                 +---------+---------------+---------+-----------+----------+--------------+ PTV      Full                                                        +---------+---------------+---------+-----------+----------+--------------+ PERO     Full                                                        +---------+---------------+---------+-----------+----------+--------------+     Summary: LEFT: - There is no evidence of deep vein thrombosis in the lower extremity.  - No cystic structure found in the popliteal fossa.  *See table(s) above for measurements and observations. Electronically signed by Gretta Began MD on 07/29/2020 at 4:55:04 PM.    Final    VAS [place] LOWER EXTREMITY VENOUS (DVT) (ONLY [facility])  Result Date: 07/28/2020  Lower Venous DVT Study Indications: Pain, Swelling, and Edema.  Limitations: Poor ultrasound/tissue interface and body habitus. Comparison Study: 07/11/17 previous Performing Technologist: Blanch Media RVS  Examination Guidelines: A complete evaluation includes B-mode imaging, spectral Doppler,  color Doppler, and power Doppler as needed of all accessible portions of each vessel. Bilateral testing is considered an integral part of a complete examination. Limited examinations for reoccurring indications may be performed as noted. The reflux portion of the exam is performed with the patient in reverse Trendelenburg.  +---------+---------------+---------+-----------+----------+-------------------+ RIGHT    CompressibilityPhasicitySpontaneityPropertiesThrombus Aging      +---------+---------------+---------+-----------+----------+-------------------+ CFV      Full           Yes      Yes                                      +---------+---------------+---------+-----------+----------+-------------------+ SFJ  Full                                                             +---------+---------------+---------+-----------+----------+-------------------+ FV Prox  Full                                                             +---------+---------------+---------+-----------+----------+-------------------+ FV Mid   Full           Yes      Yes                                      +---------+---------------+---------+-----------+----------+-------------------+ FV DistalFull           Yes      Yes                                      +---------+---------------+---------+-----------+----------+-------------------+ PFV      Full                                                             +---------+---------------+---------+-----------+----------+-------------------+ POP      Full           Yes      Yes                                      +---------+---------------+---------+-----------+----------+-------------------+ PTV      Full                                                             +---------+---------------+---------+-----------+----------+-------------------+ PERO                                                  Not well visualized  +---------+---------------+---------+-----------+----------+-------------------+   +----+---------------+---------+-----------+----------+--------------+ LEFTCompressibilityPhasicitySpontaneityPropertiesThrombus Aging +----+---------------+---------+-----------+----------+--------------+ CFV Full           Yes      Yes                                 +----+---------------+---------+-----------+----------+--------------+     Summary: RIGHT: - There is no evidence of deep vein thrombosis in the lower extremity.  - No cystic structure found in the popliteal fossa.  LEFT: - No evidence of common femoral vein obstruction.  *See table(s) above for measurements  and observations. Electronically signed by Sherald Hess MD on 07/28/2020 at 4:01:04 PM.    Final      TODAY-DAY OF DISCHARGE:  Subjective:   Daniel Charles today has no headache,no chest abdominal pain,no new weakness tingling or numbness, feels much better wants to go home today.   Objective:   Blood pressure (!) 150/89, pulse 94, temperature 98.2 F (36.8 C), temperature source Oral, resp. rate 16, height 6\' 2"  (1.88 m), weight (!) 175 kg, SpO2 96 %.  Intake/Output Summary (Last 24 hours) at 08/01/2020 0919 Last data filed at 08/01/2020 0506 Gross per 24 hour  Intake -  Output 1100 ml  Net -1100 ml   Filed Weights   07/30/20 0624 07/31/20 0410 08/01/20 0501  Weight: (!) 177.2 kg (!) 176 kg (!) 175 kg    Exam: Awake Alert, Oriented *3, No new F.N deficits, Normal affect [place].AT,PERRAL Supple Neck,No JVD, No cervical lymphadenopathy appriciated.  Symmetrical Chest wall movement, Good air movement bilaterally, CTAB RRR,No Gallops,Rubs or new Murmurs, No Parasternal Heave +ve B.Sounds, Abd Soft, Non tender, No organomegaly appriciated, No rebound -guarding or rigidity. No Cyanosis, Clubbing or edema, No new Rash or bruise   PERTINENT RADIOLOGIC STUDIES: DG Chest 2 View  Result Date: 07/28/2020 CLINICAL DATA:   Shortness of breath EXAM: CHEST - 2 VIEW COMPARISON:  None. FINDINGS: Low lung volumes. Mild patchy density at the lung bases. No significant pleural effusion. No pneumothorax. Cardiomediastinal contours are within normal limits. IMPRESSION: Mild patchy atelectasis/consolidation at the lung bases. Electronically Signed   By: Guadlupe Spanish M.D.   On: 07/28/2020 12:17   DG Tibia/Fibula Right  Result Date: 07/28/2020 CLINICAL DATA:  posterior tib pain Right leg pain no known injury.  Swelling and bruising. EXAM: RIGHT TIBIA AND FIBULA - 2 VIEW COMPARISON:  None. FINDINGS: The cortical margins of the tibia and fibula are intact. There is no evidence of fracture or other focal bone lesions. Knee and ankle alignment are maintained. Diffuse soft tissue edema throughout the lower leg. No soft tissue air. IMPRESSION: Diffuse soft tissue edema throughout the lower leg. No osseous abnormality. Electronically Signed   By: Narda Rutherford M.D.   On: 07/28/2020 15:55   CT Angio Chest PE W and/or Wo Contrast  Result Date: 07/28/2020 CLINICAL DATA:  PE suspected, high prob Leg pain.  Right flank pain. EXAM: CT ANGIOGRAPHY CHEST WITH CONTRAST TECHNIQUE: Multidetector CT imaging of the chest was performed using the standard protocol during bolus administration of intravenous contrast. Multiplanar CT image reconstructions and MIPs were obtained to evaluate the vascular anatomy. CONTRAST:  60mL OMNIPAQUE IOHEXOL 350 MG/ML SOLN COMPARISON:  Chest radiograph earlier today. FINDINGS: Cardiovascular: Examination is positive for acute bilateral pulmonary emboli. Evaluation is technically limited due to soft tissue attenuation from habitus and contrast bolus timing. Filling defects are seen on the right involving the distal main pulmonary artery extending into the upper, middle, and lower lobar pulmonary arteries, with segmental and subsegmental involvement of the lower lobe. Filling defects on the left involve the left lower  lobar, upper and lower segmental and scattered subsegmental pulmonary arteries. Thromboembolic burden is moderate. RV to LV ratio is borderline at 1. The heart is normal in size. No aortic dissection. Conventional branching pattern from the aortic arch. No pericardial effusion. Mediastinum/Nodes: Enlarged low right hilar node measuring 19 mm. Left lower paratracheal node measures 12 mm, series 5, image 36. Additional smaller mediastinal lymph nodes that are prominent but not enlarged by size criteria. No visualized  thyroid nodule. Decompressed esophagus. Lungs/Pleura: Consolidation in the right lower lobe abuts the pleura, has surrounding ground-glass opacity and is suspicious for pulmonary infarct. There is linear atelectasis in the left lower lobe and right middle lobe. Minimal right pleural thickening/trace effusion. The trachea and central bronchi are patent. Upper Abdomen: Enlarged liver with suspected steatosis, partially included. Musculoskeletal: There are no acute or suspicious osseous abnormalities. Review of the MIP images confirms the above findings. IMPRESSION: 1. Examination is positive for acute bilateral pulmonary emboli. Thromboembolic burden is moderate. Borderline RV to LV ratio at 1. 2. Right lower lobe consolidation with surrounding ground-glass opacity, suspicious for pulmonary infarct. 3. Enlarged low right hilar and mediastinal lymph nodes are likely reactive. 4. Enlarged liver with suspected steatosis, partially included. Critical Value/emergent results were called by telephone at the time of interpretation on 07/28/2020 at 5:47 pm to provider Messner , who verbally acknowledged these results. Electronically Signed   By: Narda Rutherford M.D.   On: 07/28/2020 17:56   ECHOCARDIOGRAM COMPLETE  Result Date: 07/29/2020    ECHOCARDIOGRAM REPORT   Patient Name:   Daniel Charles Date of Exam: 07/29/2020 Medical Rec #:  [id number]           Height:       74.0 in Accession #:    [id number]           Weight:       384.5 lb Date of Birth:  11-29-1985           BSA:          2.869 m Patient Age:    34 years            BP:           128/71 mmHg Patient Gender: M                   HR:           107 bpm. Exam Location:  Inpatient Procedure: 2D Echo, Color Doppler and Cardiac Doppler Indications:    Pulmonary Embolus 415.19 / I26.99  History:        Patient has no prior history of Echocardiogram examinations.                 COVID 19. Chest pain. Shortness of Breath.  Sonographer:    Leta Jungling RDCS Referring Phys: [id number] Cecille Po MELVIN IMPRESSIONS  1. Left ventricular ejection fraction, by estimation, is 60 to 65%. The left ventricle has normal function. The left ventricle has no regional wall motion abnormalities. Left ventricular diastolic parameters are consistent with Grade I diastolic dysfunction (impaired relaxation).  2. The right ventricle is poorly evaluated, but does not appear to be dilated. Right ventricular systolic function was not well visualized. The right ventricular size is not well visualized. Tricuspid regurgitation signal is inadequate for assessing PA pressure.  3. The mitral valve is normal in structure. No evidence of mitral valve regurgitation. No evidence of mitral stenosis.  4. The aortic valve is normal in structure. Aortic valve regurgitation is not visualized. No aortic stenosis is present.  5. Aortic root diameter at upper limit of normal when body size is taken into consideration. Aortic dilatation noted. There is mild dilatation of the aortic root, measuring 39 mm.  6. The inferior vena cava is normal in size with greater than 50% respiratory variability, suggesting right atrial pressure of 3 mmHg. FINDINGS  Left Ventricle: Left ventricular ejection fraction, by estimation, is 60 to 65%.  The left ventricle has normal function. The left ventricle has no regional wall motion abnormalities. The left ventricular internal cavity size was normal in size. There is  borderline  concentric left ventricular hypertrophy. Left ventricular diastolic parameters are consistent with Grade I diastolic dysfunction (impaired relaxation). Normal left ventricular filling pressure. Right Ventricle: The right ventricle is poorly evaluated, but does not appear to be dilated. The right ventricular size is not well visualized. No increase in right ventricular wall thickness. Right ventricular systolic function was not well visualized. Tricuspid regurgitation signal is inadequate for assessing PA pressure. Left Atrium: Left atrial size was normal in size. Right Atrium: Right atrial size was normal in size. Pericardium: There is no evidence of pericardial effusion. Mitral Valve: The mitral valve is normal in structure. No evidence of mitral valve regurgitation. No evidence of mitral valve stenosis. Tricuspid Valve: The tricuspid valve is normal in structure. Tricuspid valve regurgitation is not demonstrated. No evidence of tricuspid stenosis. Aortic Valve: The aortic valve is normal in structure. Aortic valve regurgitation is not visualized. No aortic stenosis is present. Pulmonic Valve: The pulmonic valve was normal in structure. Pulmonic valve regurgitation is not visualized. No evidence of pulmonic stenosis. Aorta: Aortic root diameter at upper limit of normal when body size is taken into consideration. The aortic root is normal in size and structure and aortic dilatation noted. There is mild dilatation of the aortic root, measuring 39 mm. Venous: The inferior vena cava is normal in size with greater than 50% respiratory variability, suggesting right atrial pressure of 3 mmHg. IAS/Shunts: No atrial level shunt detected by color flow Doppler.  LEFT VENTRICLE PLAX 2D LVIDd:         4.80 cm      Diastology LVIDs:         3.80 cm      LV e' medial:    6.31 cm/s LV PW:         1.13 cm      LV E/e' medial:  8.0 LV IVS:        1.30 cm      LV e' lateral:   6.42 cm/s LVOT diam:     2.00 cm      LV E/e' lateral:  7.9 LV SV:         43 LV SV Index:   15 LVOT Area:     3.14 cm  LV Volumes (MOD) LV vol d, MOD A2C: 110.0 ml LV vol d, MOD A4C: 149.0 ml LV vol s, MOD A2C: 51.7 ml LV vol s, MOD A4C: 26.2 ml LV SV MOD A2C:     58.3 ml LV SV MOD A4C:     149.0 ml LV SV MOD BP:      91.8 ml LEFT ATRIUM             Index LA diam:        2.90 cm 1.01 cm/m LA Vol (A2C):   20.8 ml 7.25 ml/m LA Vol (A4C):   27.4 ml 9.57 ml/m LA Biplane Vol: 23.7 ml 8.26 ml/m  AORTIC VALVE LVOT Vmax:   110.00 cm/s LVOT Vmean:  68.100 cm/s LVOT VTI:    0.138 m  AORTA Ao Root diam: 3.90 cm Ao Asc diam:  3.30 cm MITRAL VALVE MV Area (PHT): 3.72 cm    SHUNTS MV Decel Time: 204 msec    Systemic VTI:  0.14 m MV E velocity: 50.50 cm/s  Systemic Diam: 2.00 cm MV A velocity: 60.60  cm/s MV E/A ratio:  0.83 Mihai Croitoru MD Electronically signed by Thurmon Fair MD Signature Date/Time: 07/29/2020/10:10:00 AM    Final    VAS [place] LOWER EXTREMITY VENOUS (DVT)  Result Date: 07/29/2020  Lower Venous DVT Study Indications: Pulmonary embolism.  Risk Factors: Confirmed PE. Comparison Study: No previous Left leg duplex. RLE negative Performing Technologist: Clint Guy RVT  Examination Guidelines: A complete evaluation includes B-mode imaging, spectral Doppler, color Doppler, and power Doppler as needed of all accessible portions of each vessel. Bilateral testing is considered an integral part of a complete examination. Limited examinations for reoccurring indications may be performed as noted. The reflux portion of the exam is performed with the patient in reverse Trendelenburg.  +---------+---------------+---------+-----------+----------+--------------+ LEFT     CompressibilityPhasicitySpontaneityPropertiesThrombus Aging +---------+---------------+---------+-----------+----------+--------------+ CFV      Full           Yes      Yes                                 +---------+---------------+---------+-----------+----------+--------------+ SFJ      Full                                                         +---------+---------------+---------+-----------+----------+--------------+ FV Prox  Full                                                        +---------+---------------+---------+-----------+----------+--------------+ FV Mid   Full                                                        +---------+---------------+---------+-----------+----------+--------------+ FV DistalFull                                                        +---------+---------------+---------+-----------+----------+--------------+ PFV      Full                                                        +---------+---------------+---------+-----------+----------+--------------+ POP      Full           Yes      Yes                                 +---------+---------------+---------+-----------+----------+--------------+ PTV      Full                                                        +---------+---------------+---------+-----------+----------+--------------+  PERO     Full                                                        +---------+---------------+---------+-----------+----------+--------------+     Summary: LEFT: - There is no evidence of deep vein thrombosis in the lower extremity.  - No cystic structure found in the popliteal fossa.  *See table(s) above for measurements and observations. Electronically signed by Gretta Began MD on 07/29/2020 at 4:55:04 PM.    Final    VAS [place] LOWER EXTREMITY VENOUS (DVT) (ONLY [facility])  Result Date: 07/28/2020  Lower Venous DVT Study Indications: Pain, Swelling, and Edema.  Limitations: Poor ultrasound/tissue interface and body habitus. Comparison Study: 07/11/17 previous Performing Technologist: Blanch Media RVS  Examination Guidelines: A complete evaluation includes B-mode imaging, spectral Doppler, color Doppler, and power Doppler as needed of all accessible portions of each vessel. Bilateral  testing is considered an integral part of a complete examination. Limited examinations for reoccurring indications may be performed as noted. The reflux portion of the exam is performed with the patient in reverse Trendelenburg.  +---------+---------------+---------+-----------+----------+-------------------+ RIGHT    CompressibilityPhasicitySpontaneityPropertiesThrombus Aging      +---------+---------------+---------+-----------+----------+-------------------+ CFV      Full           Yes      Yes                                      +---------+---------------+---------+-----------+----------+-------------------+ SFJ      Full                                                             +---------+---------------+---------+-----------+----------+-------------------+ FV Prox  Full                                                             +---------+---------------+---------+-----------+----------+-------------------+ FV Mid   Full           Yes      Yes                                      +---------+---------------+---------+-----------+----------+-------------------+ FV DistalFull           Yes      Yes                                      +---------+---------------+---------+-----------+----------+-------------------+ PFV      Full                                                             +---------+---------------+---------+-----------+----------+-------------------+  POP      Full           Yes      Yes                                      +---------+---------------+---------+-----------+----------+-------------------+ PTV      Full                                                             +---------+---------------+---------+-----------+----------+-------------------+ PERO                                                  Not well visualized +---------+---------------+---------+-----------+----------+-------------------+    +----+---------------+---------+-----------+----------+--------------+ LEFTCompressibilityPhasicitySpontaneityPropertiesThrombus Aging +----+---------------+---------+-----------+----------+--------------+ CFV Full           Yes      Yes                                 +----+---------------+---------+-----------+----------+--------------+     Summary: RIGHT: - There is no evidence of deep vein thrombosis in the lower extremity.  - No cystic structure found in the popliteal fossa.  LEFT: - No evidence of common femoral vein obstruction.  *See table(s) above for measurements and observations. Electronically signed by Sherald Hess MD on 07/28/2020 at 4:01:04 PM.    Final      PERTINENT LAB RESULTS: CBC: Recent Labs    07/31/20 0108 08/01/20 0532  WBC 26.3* 23.1*  HGB 12.3* 12.1*  HCT 37.2* 36.3*  PLT 331 331   CMET CMP     Component Value Date/Time   NA 137 08/01/2020 0532   K 4.1 08/01/2020 0532   CL 103 08/01/2020 0532   CO2 23 08/01/2020 0532   GLUCOSE 195 (H) 08/01/2020 0532   BUN 11 08/01/2020 0532   CREATININE 0.86 08/01/2020 0532   CALCIUM 8.4 (L) 08/01/2020 0532   PROT 6.5 08/01/2020 0532   ALBUMIN 2.1 (L) 08/01/2020 0532   AST 61 (H) 08/01/2020 0532   ALT 70 (H) 08/01/2020 0532   ALKPHOS 60 08/01/2020 0532   BILITOT 0.7 08/01/2020 0532   GFRNONAA >60 08/01/2020 0532    GFR Estimated Creatinine Clearance: 202.3 mL/min (by C-G formula based on SCr of 0.86 mg/dL). No results for input(s): LIPASE, AMYLASE in the last 72 hours. No results for input(s): CKTOTAL, CKMB, CKMBINDEX, TROPONINI in the last 72 hours. Invalid input(s): POCBNP Recent Labs    07/31/20 0108 08/01/20 0532  DDIMER 1.50* 1.65*   No results for input(s): HGBA1C in the last 72 hours. No results for input(s): CHOL, HDL, LDLCALC, TRIG, CHOLHDL, LDLDIRECT in the last 72 hours. No results for input(s): TSH, T4TOTAL, T3FREE, THYROIDAB in the last 72 hours.  Invalid input(s): FREET3 No  results for input(s): VITAMINB12, FOLATE, FERRITIN, TIBC, IRON, RETICCTPCT in the last 72 hours. Coags: No results for input(s): INR in the last 72 hours.  Invalid input(s): PT Microbiology: Recent Results (from the past 240 hour(s))  SARS Coronavirus 2 by RT PCR (hospital order, performed in [facility] hospital  lab) Nasopharyngeal Nasopharyngeal Swab     Status: Abnormal   Collection Time: 07/28/20  3:21 PM   Specimen: Nasopharyngeal Swab  Result Value Ref Range Status   SARS Coronavirus 2 POSITIVE (A) NEGATIVE Final    Comment: RESULT CALLED TO, READ BACK BY AND VERIFIED WITH: RN JOSH N 2005 [id number] FCP (NOTE) SARS-CoV-2 target nucleic acids are DETECTED  SARS-CoV-2 RNA is generally detectable in upper respiratory specimens  during the acute phase of infection.  Positive results are indicative  of the presence of the identified virus, but do not rule out bacterial infection or co-infection with other pathogens not detected by the test.  Clinical correlation with patient history and  other diagnostic information is necessary to determine patient infection status.  The expected result is negative.  Fact Sheet for Patients:   BoilerBrush.com.cy   Fact Sheet for Healthcare Providers:   https://pope.com/    This test is not yet approved or cleared by the [place] FDA and  has been authorized for detection and/or diagnosis of SARS-CoV-2 by FDA under an Emergency Use Authorization (EUA).  This EUA will remain in effect (meaning this test can be  used) for the duration of  the COVID-19 declaration under Section 564(b)(1) of the Act, 21 U.S.C. section 360-bbb-3(b)(1), unless the authorization is terminated or revoked sooner.  Performed at [facility], [address], [place], [place] [address]     FURTHER DISCHARGE INSTRUCTIONS:  Get Medicines reviewed and adjusted: Please take all your medications with you for your  next visit with your Primary MD  Laboratory/radiological data: Please request your Primary MD to go over all hospital tests and procedure/radiological results at the follow up, please ask your Primary MD to get all Hospital records sent to his/her office.  In some cases, they will be blood work, cultures and biopsy results pending at the time of your discharge. Please request that your primary care M.D. goes through all the records of your hospital data and follows up on these results.  Also Note the following: If you experience worsening of your admission symptoms, develop shortness of breath, life threatening emergency, suicidal or homicidal thoughts you must seek medical attention immediately by calling 911 or calling your MD immediately  if symptoms less severe.  You must read complete instructions/literature along with all the possible adverse reactions/side effects for all the Medicines you take and that have been prescribed to you. Take any new Medicines after you have completely understood and accpet all the possible adverse reactions/side effects.   Do not drive when taking Pain medications or sleeping medications (Benzodaizepines)  Do not take more than prescribed Pain, Sleep and Anxiety Medications. It is not advisable to combine anxiety,sleep and pain medications without talking with your primary care practitioner  Special Instructions: If you have smoked or chewed Tobacco  in the last 2 yrs please stop smoking, stop any regular Alcohol  and or any Recreational drug use.  Wear Seat belts while driving.  Please note: You were cared for by a hospitalist during your hospital stay. Once you are discharged, your primary care physician will handle any further medical issues. Please note that NO REFILLS for any discharge medications will be authorized once you are discharged, as it is imperative that you return to your primary care physician (or establish a relationship with a primary care  physician if you do not have one) for your post hospital discharge needs so that they can reassess your need for medications  and monitor your lab values.  Total Time spent coordinating discharge including counseling, education and face to face time equals 35 minutes.  SignedJeoffrey Massed 08/01/2020 9:19 AM

## 2020-08-01 NOTE — Plan of Care (Signed)
  Problem: Education: Goal: Knowledge of General Education information will improve Description: Including pain rating scale, medication(s)/side effects and non-pharmacologic comfort measures Outcome: Adequate for Discharge   Problem: Health Behavior/Discharge Planning: Goal: Ability to manage health-related needs will improve Outcome: Adequate for Discharge   Problem: Clinical Measurements: Goal: Ability to maintain clinical measurements within normal limits will improve Outcome: Adequate for Discharge Goal: Will remain free from infection Outcome: Adequate for Discharge Goal: Diagnostic test results will improve Outcome: Adequate for Discharge Goal: Respiratory complications will improve Outcome: Adequate for Discharge Goal: Cardiovascular complication will be avoided Outcome: Adequate for Discharge   Problem: Activity: Goal: Risk for activity intolerance will decrease Outcome: Adequate for Discharge   Problem: Nutrition: Goal: Adequate nutrition will be maintained Outcome: Adequate for Discharge   Problem: Coping: Goal: Level of anxiety will decrease Outcome: Adequate for Discharge   Problem: Elimination: Goal: Will not experience complications related to bowel motility Outcome: Adequate for Discharge Goal: Will not experience complications related to urinary retention Outcome: Adequate for Discharge   Problem: Pain Managment: Goal: General experience of comfort will improve Outcome: Adequate for Discharge   Problem: Safety: Goal: Ability to remain free from injury will improve Outcome: Adequate for Discharge   Problem: Skin Integrity: Goal: Risk for impaired skin integrity will decrease Outcome: Adequate for Discharge   Problem: Increased Nutrient Needs (NI-5.1) Goal: Food and/or nutrient delivery Description: Individualized approach for food/nutrient provision. Outcome: Adequate for Discharge   Problem: Acute Rehab PT Goals(only PT should resolve) Goal:  Patient Will Transfer Sit To/From Stand Outcome: Adequate for Discharge Goal: Pt Will Ambulate Outcome: Adequate for Discharge Goal: PT Additional Goal #1 Outcome: Adequate for Discharge   Problem: Acute Rehab PT Goals(only PT should resolve) Goal: Pt Will Go Up/Down Stairs Outcome: Adequate for Discharge

## 2020-08-04 ENCOUNTER — Telehealth (INDEPENDENT_AMBULATORY_CARE_PROVIDER_SITE_OTHER): Payer: Self-pay

## 2020-08-04 NOTE — Telephone Encounter (Signed)
Copied from CRM [id number]. Topic: General - Other >> Aug 04, 2020  9:44 AM Wyonia Hough E wrote: Reason for CRM: [facility] called to let PCP know that the pt wants to hold off until 2.14.22 to begin his Home health due to isolation from covid   This is an [place]

## 2020-08-06 ENCOUNTER — Telehealth (HOSPITAL_COMMUNITY): Payer: Self-pay | Admitting: Pharmacist

## 2020-08-06 NOTE — Telephone Encounter (Signed)
Pharmacy Transitions of Care Follow-up Telephone Call  Date of discharge: 2/4 Discharge Diagnosis: PE  How have you been since you were released from the hospital? See patient assessment   Medication changes made at discharge: Eliquis, Benzonatate, Percocet, Prednisone  Medication changes obtained and verified? Yes    Medication Accessibility:  Home Pharmacy: [facility]  Was the patient provided with refills on discharged medications? No  Have all prescriptions been transferred from [facility] to home pharmacy? N/A  Is the patient able to afford medications?  Marland Kitchen Notable copays: $508.35 for 30day supply    Medication Review:  APIXABEN (ELIQUIS)  Apixaban 10 mg BID initiated on 2/5. Will switch to apixaban 5 mg BID after 7 days (2/12).  - Discussed importance of taking medication around the same time everyday  - Reviewed potential DDIs with patient  - Advised patient of medications to avoid (NSAIDs, ASA)  - Educated that Tylenol (acetaminophen) will be the preferred analgesic to prevent risk of bleeding  - Emphasized importance of monitoring for signs and symptoms of bleeding (abnormal bruising, prolonged bleeding, nose bleeds, bleeding from gums, discolored urine, black tarry stools)  - Advised patient to alert all providers of anticoagulation therapy prior to starting a new medication or having a procedure    Follow-up Appointments:  PCP Hospital f/u appt confirmed?  Scheduled to see Gwinda Passe on 08/12/20 @0910   If their condition worsens, is the pt aware to call PCP or go to the Emergency Dept.? Yes  Final Patient Assessment:   Pt is doing well except for a mild rash and itching on his back that started while in the hospital. Advised to take Benadryl if needed. He will mention the rash and itching to his MD with his next visit. He also reports mild muscle aches that is getting better everyday.  He forgot about his follow up visit so I reminded him about the  date and time and to make sure to ask for a prescription for his Eliquis.  Will check if patient can use a coupon for his Eliquis or if he qualifies for patient assistance.  , PharmD

## 2020-08-07 ENCOUNTER — Telehealth (HOSPITAL_COMMUNITY): Payer: Self-pay | Admitting: Pharmacist

## 2020-08-07 NOTE — Telephone Encounter (Signed)
  Obtained an Eliquis coupon card for the patient and a test claim was done. Co-pay is $10 per 30 days supply. Coupon card information was emailed to patient.  Dorethea Clan, PharmD

## 2020-08-12 ENCOUNTER — Other Ambulatory Visit: Payer: Self-pay

## 2020-08-12 ENCOUNTER — Encounter (INDEPENDENT_AMBULATORY_CARE_PROVIDER_SITE_OTHER): Payer: Self-pay | Admitting: Primary Care

## 2020-08-12 ENCOUNTER — Ambulatory Visit (INDEPENDENT_AMBULATORY_CARE_PROVIDER_SITE_OTHER): Payer: Commercial Managed Care - PPO | Admitting: Primary Care

## 2020-08-12 DIAGNOSIS — Z7689 Persons encountering health services in other specified circumstances: Secondary | ICD-10-CM

## 2020-08-12 DIAGNOSIS — I2694 Multiple subsegmental pulmonary emboli without acute cor pulmonale: Secondary | ICD-10-CM

## 2020-08-12 DIAGNOSIS — Z09 Encounter for follow-up examination after completed treatment for conditions other than malignant neoplasm: Secondary | ICD-10-CM | POA: Diagnosis not present

## 2020-08-12 NOTE — Progress Notes (Signed)
I connected with  Daniel Charles on 08/12/20 by a video enabled telemedicine application and verified that I am speaking with the correct person using two identifiers.   I discussed the limitations of evaluation and management by telemedicine. The patient expressed understanding and agreed to proceed.    Pt is no longer having any symptoms

## 2020-08-16 NOTE — Progress Notes (Signed)
.Virtual Visit via Telephone Note  I connected with Daniel Charles on 08/16/20 at  9:10 AM EST by telephone and verified that I am speaking with the correct person using two identifiers.  Location: Patient: Home  provider: Gwinda Passe at [facility]   I discussed the limitations, risks, security and privacy concerns of performing an evaluation and management service by telephone and the availability of in person appointments. I also discussed with the patient that there may be a patient responsible charge related to this service. The patient expressed understanding and agreed to proceed.   History of Present Illness:  Daniel Charles is a 35 year old male  follow up from the hospital. Admit date to the hospital was 07/28/20, patient was discharged from the hospital on 08/01/20, patient was admitted for:  Pulmonary embolism (HCC) and SIRS (systemic inflammatory response syndrome) (HCC).  Patient states she no longer has any symptoms previously noted leg pain and shortness of breath.  He is also establishing care with a new provider.  No past medical history on file.  Current Outpatient Medications on File Prior to Visit  Medication Sig Dispense Refill  . APIXABAN (ELIQUIS) VTE STARTER PACK (10MG  AND 5MG ) Take as directed on package: start with two-5mg  tablets twice daily for 7 days. On day 8, switch to one-5mg  tablet twice daily. 1 each 0  . oxyCODONE-acetaminophen (PERCOCET) 5-325 MG tablet Take 1 tablet by mouth every 8 (eight) hours as needed for severe pain (right leg pain). (Patient not taking: Reported on 08/12/2020) 15 tablet 0  . predniSONE (DELTASONE) 10 MG tablet Take 40 mg daily for 1 day, 30 mg daily for 1 day, 20 mg daily for 1 days,10 mg daily for 1 day, then stop (Patient not taking: Reported on 08/12/2020) 10 tablet 0   No current facility-administered medications on file prior to visit.   Observations/Objective: Vital signs not taken on telemetry  visit pertinent positives and negatives are noted in HPI  Assessment and Plan: Daniel Charles was seen today for hospitalization follow-up.  Diagnoses and all orders for this visit:  Multiple subsegmental pulmonary emboli without acute cor pulmonale (HCC) Patient presented with 6 days of calf pain with 1 day of chest pain with associated shortness of breath tachycardia.  Underlining causes or factors he works as a 08/14/2020 who takes long trips. Medication started on Eliquis starter pack.  Discussed increased risks for bruising and bleeding.  Use a soft toothbrush careful with shaving note if he sees any blood in urine or stool to contact office  Encounter to establish care Establish care with new provider  Hospital discharge follow-up Retrieved from hospital discharge 1 Follow up with [facility] on 08/12/2020; at 9am, this will be a virtual visit via telephone.  Completed 2 Follow up with [facility]); HHPT/OT arranged - they will reach out to you to schedule home visits within 48 hr post discharge- [phone])     Follow Up Instructions:    I discussed the assessment and treatment plan with the patient. The patient was provided an opportunity to ask questions and all were answered. The patient agreed with the plan and demonstrated an understanding of the instructions.   The patient was advised to call back or seek an in-person evaluation if the symptoms worsen or if the condition fails to improve as anticipated.  I provided 25  minutes of non-face-to-face time during this encounter.  This includes hospital encounters labs and  imaging   Kerin Perna, NP

## 2020-08-21 ENCOUNTER — Other Ambulatory Visit (INDEPENDENT_AMBULATORY_CARE_PROVIDER_SITE_OTHER): Payer: Commercial Managed Care - PPO

## 2020-08-21 ENCOUNTER — Other Ambulatory Visit: Payer: Self-pay

## 2020-08-21 DIAGNOSIS — D649 Anemia, unspecified: Secondary | ICD-10-CM

## 2020-08-21 DIAGNOSIS — Z135 Encounter for screening for eye and ear disorders: Secondary | ICD-10-CM

## 2020-08-22 LAB — CBC WITH DIFFERENTIAL/PLATELET
Basophils Absolute: 0 10*3/uL (ref 0.0–0.2)
Basos: 1 %
EOS (ABSOLUTE): 0.2 10*3/uL (ref 0.0–0.4)
Eos: 3 %
Hematocrit: 40.5 % (ref 37.5–51.0)
Hemoglobin: 13 g/dL (ref 13.0–17.7)
Immature Grans (Abs): 0 10*3/uL (ref 0.0–0.1)
Immature Granulocytes: 0 %
Lymphocytes Absolute: 2.4 10*3/uL (ref 0.7–3.1)
Lymphs: 35 %
MCH: 22.1 pg — ABNORMAL LOW (ref 26.6–33.0)
MCHC: 32.1 g/dL (ref 31.5–35.7)
MCV: 69 fL — ABNORMAL LOW (ref 79–97)
Monocytes Absolute: 0.5 10*3/uL (ref 0.1–0.9)
Monocytes: 7 %
Neutrophils Absolute: 3.8 10*3/uL (ref 1.4–7.0)
Neutrophils: 54 %
Platelets: 269 10*3/uL (ref 150–450)
RBC: 5.87 x10E6/uL — ABNORMAL HIGH (ref 4.14–5.80)
RDW: 15.8 % — ABNORMAL HIGH (ref 11.6–15.4)
WBC: 7 10*3/uL (ref 3.4–10.8)

## 2020-08-22 LAB — HEMOGLOBIN A1C
Est. average glucose Bld gHb Est-mCnc: 126 mg/dL
Hgb A1c MFr Bld: 6 % — ABNORMAL HIGH (ref 4.8–5.6)

## 2020-09-03 ENCOUNTER — Telehealth (INDEPENDENT_AMBULATORY_CARE_PROVIDER_SITE_OTHER): Payer: Self-pay

## 2020-09-03 ENCOUNTER — Other Ambulatory Visit (INDEPENDENT_AMBULATORY_CARE_PROVIDER_SITE_OTHER): Payer: Self-pay | Admitting: Primary Care

## 2020-09-03 DIAGNOSIS — I2694 Multiple subsegmental pulmonary emboli without acute cor pulmonale: Secondary | ICD-10-CM

## 2020-09-03 NOTE — Telephone Encounter (Signed)
Labs normal but 6.0 is prediabetes needs to watch carbs

## 2020-09-03 NOTE — Telephone Encounter (Signed)
Copied from CRM [id number]. Topic: Referral - Request for Referral >> Sep 03, 2020  8:52 AM Wyonia Hough E wrote: Has patient seen PCP for this complaint? Yes  *If NO, is insurance requiring patient see PCP for this issue before PCP can refer them? Referral for which specialty: Hematologist  Preferred provider/office:  Reason for referral: blood clot

## 2020-09-03 NOTE — Telephone Encounter (Signed)
Patient called wanting to know his lab results.  Please advice

## 2020-09-03 NOTE — Telephone Encounter (Signed)
Medication Refill - Medication: APIXABAN (ELIQUIS) VTE STARTER PACK (10MG  AND 5MG    Has the patient contacted their pharmacy? Yes.   (Agent: If no, request that the patient contact the pharmacy for the refill.) (Agent: If yes, when and what did the pharmacy advise?)call pcp  Preferred Pharmacy (with phone number or street name):  [facility] - [place], [place] - [address] Phone:  [phone]  Fax:  [phone]       Agent: Please be advised that RX refills may take up to 3 business days. We ask that you follow-up with your pharmacy.

## 2020-09-03 NOTE — Telephone Encounter (Signed)
Copied from CRM [id number]. Topic: General - Other >> Sep 03, 2020  8:50 AM Wyonia Hough E wrote: Reason for CRM: Pt has been out of work for about a month now due to a blood clot and he asked for a letter to give his employer about this and stating that the provider is working on a referral to a specialist for this issue so they can hold his position at work/ they are needed this letter asap or he will be terminated / please advise

## 2020-09-03 NOTE — Telephone Encounter (Signed)
Please address patients lab results.

## 2020-09-03 NOTE — Telephone Encounter (Signed)
Daniel Charles Duster   It is hard for me to advise without examining this pt.

## 2020-09-03 NOTE — Telephone Encounter (Signed)
Sent to PCP ?

## 2020-09-04 NOTE — Telephone Encounter (Signed)
Spoke with patient. After verifying date of birth he was informed that labs are normal. He is aware of prediabetes, advised to watch carb intake. He verbalized understanding.

## 2020-09-05 NOTE — Telephone Encounter (Signed)
  Requested Prescriptions  Pending Prescriptions Disp Refills   APIXABAN (ELIQUIS) VTE STARTER PACK (10MG  AND 5MG ) 1 tablet 0    Sig: Take as directed on package: start with two-5mg  tablets twice daily for 7 days. On day 8, switch to one-5mg  tablet twice daily.      Hematology:  Anticoagulants Failed - 09/05/2020  5:12 PM      Failed - Valid encounter within last 12 months    Recent Outpatient Visits           3 weeks ago Multiple subsegmental pulmonary emboli without acute cor pulmonale (HCC)   [facility] , NP                Passed - HGB in normal range and within 360 days    Hemoglobin  Date Value Ref Range Status  08/21/2020 13.0 13.0 - 17.7 g/dL Final          Passed - PLT in normal range and within 360 days    Platelets  Date Value Ref Range Status  08/21/2020 269 150 - 450 x10E3/uL Final          Passed - HCT in normal range and within 360 days    Hematocrit  Date Value Ref Range Status  08/21/2020 40.5 37.5 - 51.0 % Final          Passed - Cr in normal range and within 360 days    Creatinine, Ser  Date Value Ref Range Status  08/01/2020 0.86 0.61 - 1.24 mg/dL Final

## 2020-09-05 NOTE — Telephone Encounter (Signed)
Pt called in to follow up on refill request for  APIXABAN (ELIQUIS) VTE STARTER PACK (10MG  AND 5MG . Pt says that he is almost out and was told that Rx would be sent in    Pharmacy:  [facility], [place] - [address] Phone:  [phone]  Fax:  [phone]

## 2020-09-05 NOTE — Telephone Encounter (Signed)
Requested medication (s) are due for refill today: Yes  Requested medication (s) are on the active medication list: Yes  Last refill:  08/01/20  Future visit scheduled: No  Notes to clinic:  Unable to refill per protocol, last refill by another provider. Sent to the wrong pharmacy.     Requested Prescriptions  Pending Prescriptions Disp Refills   APIXABAN (ELIQUIS) VTE STARTER PACK (10MG  AND 5MG ) 1 tablet 0    Sig: Take as directed on package: start with two-5mg  tablets twice daily for 7 days. On day 8, switch to one-5mg  tablet twice daily.      Hematology:  Anticoagulants Failed - 09/05/2020  5:12 PM      Failed - Valid encounter within last 12 months    Recent Outpatient Visits           3 weeks ago Multiple subsegmental pulmonary emboli without acute cor pulmonale (HCC)   [facility] , NP                Passed - HGB in normal range and within 360 days    Hemoglobin  Date Value Ref Range Status  08/21/2020 13.0 13.0 - 17.7 g/dL Final          Passed - PLT in normal range and within 360 days    Platelets  Date Value Ref Range Status  08/21/2020 269 150 - 450 x10E3/uL Final          Passed - HCT in normal range and within 360 days    Hematocrit  Date Value Ref Range Status  08/21/2020 40.5 37.5 - 51.0 % Final          Passed - Cr in normal range and within 360 days    Creatinine, Ser  Date Value Ref Range Status  08/01/2020 0.86 0.61 - 1.24 mg/dL Final

## 2020-09-05 NOTE — Telephone Encounter (Signed)
Pt called in to follow up on referral to specialist for his blood clot. Pt says that he is needing a note for his job indicating that he has a blood clot and has to be out of work. Pt says if a note is not provided he could lose his job. Pt would like to have emailed to his employer if possible.    Email: [email or web address]    Pt would like to know when this has been done if possible.

## 2020-09-08 ENCOUNTER — Other Ambulatory Visit (INDEPENDENT_AMBULATORY_CARE_PROVIDER_SITE_OTHER): Payer: Self-pay | Admitting: Primary Care

## 2020-09-08 MED ORDER — APIXABAN 5 MG PO TABS: 5.0000 mg | ORAL_TABLET | Freq: Two times a day (BID) | ORAL | 1 refills | Status: DC

## 2020-09-08 NOTE — Progress Notes (Signed)
Refilled apixaban (ELIQUIS) 5 MG TABS tablet BID until seen by pulmonologist

## 2020-09-10 ENCOUNTER — Telehealth (INDEPENDENT_AMBULATORY_CARE_PROVIDER_SITE_OTHER): Payer: Self-pay

## 2020-09-10 NOTE — Telephone Encounter (Signed)
Patient has a pulmonologist appt September 17, 2020

## 2020-09-10 NOTE — Telephone Encounter (Signed)
Copied from CRM [id number]. Topic: General - Other >> Sep 03, 2020  8:50 AM Wyonia Hough E wrote: Reason for CRM: Pt has been out of work for about a month now due to a blood clot and he asked for a letter to give his employer about this and stating that the provider is working on a referral to a specialist for this issue so they can hold his position at work/ they are needed this letter asap or he will be terminated / please advise >> Sep 09, 2020  1:37 PM Mcneil, Ja-Kwan wrote: Pt called for an update on the doctor's note that he requested. Pt stated he needs someone to call him back regarding the doctor's note and the referral to a specialist for the blood clot.

## 2020-09-10 NOTE — Telephone Encounter (Signed)
Sent to PCP ?

## 2020-09-17 ENCOUNTER — Other Ambulatory Visit: Payer: Self-pay

## 2020-09-17 ENCOUNTER — Encounter: Payer: Self-pay | Admitting: *Deleted

## 2020-09-17 ENCOUNTER — Ambulatory Visit (INDEPENDENT_AMBULATORY_CARE_PROVIDER_SITE_OTHER): Payer: Commercial Managed Care - PPO | Admitting: Pulmonary Disease

## 2020-09-17 ENCOUNTER — Encounter: Payer: Self-pay | Admitting: Pulmonary Disease

## 2020-09-17 VITALS — BP 148/80 | HR 85 | Temp 97.9°F | Ht 74.0 in | Wt 396.0 lb

## 2020-09-17 DIAGNOSIS — U099 Post covid-19 condition, unspecified: Secondary | ICD-10-CM

## 2020-09-17 DIAGNOSIS — I2694 Multiple subsegmental pulmonary emboli without acute cor pulmonale: Secondary | ICD-10-CM

## 2020-09-17 DIAGNOSIS — R599 Enlarged lymph nodes, unspecified: Secondary | ICD-10-CM

## 2020-09-17 NOTE — Progress Notes (Signed)
Dayn Barich    [id number]    Dec 19, 1985  Primary Care Physician:Patient, No Pcp Per  Referring Physician: Grayce Sessions, NP [address] [place],  [place] [address]  Chief complaint: Consult for post COVID-19, PE  HPI: 35 year old with obesity Admitted with Covid pneumonia from 07/28/2020 to 08/01/2020 Treated with steroids and discharged on room air  Also noted to have pulmonary embolism secondary to COVID-19 infection.  He was anticoagulated and discharged on Eliquis.  CT on admission showed reactive lymphadenopathy which will need follow-up  Post discharge he is doing well, back to baseline, reports no dyspnea, chest pain or leg swelling He is requesting a letter to go back to work  Pets: No pets Occupation: Naval architect Exposures: No mold, hot tub, Jacuzzi, no feather pillows or comforter Smoking history: Never smoker Travel history: Originally from [place].  No significant travel history Relevant family history: No family history of lung disease  Outpatient Encounter Medications as of 09/17/2020  Medication Sig  . apixaban (ELIQUIS) 5 MG TABS tablet Take 1 tablet (5 mg total) by mouth 2 (two) times daily.  . [DISCONTINUED] oxyCODONE-acetaminophen (PERCOCET) 5-325 MG tablet Take 1 tablet by mouth every 8 (eight) hours as needed for severe pain (right leg pain). (Patient not taking: Reported on 08/12/2020)  . [DISCONTINUED] predniSONE (DELTASONE) 10 MG tablet Take 40 mg daily for 1 day, 30 mg daily for 1 day, 20 mg daily for 1 days,10 mg daily for 1 day, then stop (Patient not taking: Reported on 08/12/2020)   No facility-administered encounter medications on file as of 09/17/2020.    Allergies as of 09/17/2020  . (No Known Allergies)    No past medical history on file.  No past surgical history on file.  Family History  Problem Relation Age of Onset  . Hypertension Mother   . Gout Father   . Deep vein thrombosis Neg Hx     Social History    Socioeconomic History  . Marital status: Single    Spouse name: Not on file  . Number of children: Not on file  . Years of education: Not on file  . Highest education level: Not on file  Occupational History  . Not on file  Tobacco Use  . Smoking status: Never Smoker  . Smokeless tobacco: Never Used  Vaping Use  . Vaping Use: Never used  Substance and Sexual Activity  . Alcohol use: No  . Drug use: Never  . Sexual activity: Not on file  Other Topics Concern  . Not on file  Social History Narrative  . Not on file   Social Determinants of Health   Financial Resource Strain: Not on file  Food Insecurity: Not on file  Transportation Needs: Not on file  Physical Activity: Not on file  Stress: Not on file  Social Connections: Not on file  Intimate Partner Violence: Not on file    Review of systems: Review of Systems  Constitutional: Negative for fever and chills.  HENT: Negative.   Eyes: Negative for blurred vision.  Respiratory: as per HPI  Cardiovascular: Negative for chest pain and palpitations.  Gastrointestinal: Negative for vomiting, diarrhea, blood per rectum. Genitourinary: Negative for dysuria, urgency, frequency and hematuria.  Musculoskeletal: Negative for myalgias, back pain and joint pain.  Skin: Negative for itching and rash.  Neurological: Negative for dizziness, tremors, focal weakness, seizures and loss of consciousness.  Endo/Heme/Allergies: Negative for environmental allergies.  Psychiatric/Behavioral: Negative for depression,  suicidal ideas and hallucinations.  All other systems reviewed and are negative.  Physical Exam: Blood pressure (!) 148/80, pulse 85, temperature 97.9 F (36.6 C), temperature source Temporal, height 6\' 2"  (1.88 m), weight (!) 396 lb (179.6 kg), SpO2 99 %. Gen:      No acute distress HEENT:  EOMI, sclera anicteric Neck:     No masses; no thyromegaly Lungs:    Clear to auscultation bilaterally; normal respiratory  effort CV:         Regular rate and rhythm; no murmurs Abd:      + bowel sounds; soft, non-tender; no palpable masses, no distension Ext:    No edema; adequate peripheral perfusion Skin:      Warm and dry; no rash Neuro: alert and oriented x 3 Psych: normal mood and affect  Data Reviewed: Imaging: CTA 07/28/2020-bilateral segmental and subsegmental PE, RV/LV ratio is 1, right lower lobe consolidation with hilar and mediastinal lymph nodes, hepatic steatosis.  I have reviewed the images personally  PFTs:  Labs:  Cardiac: Echo 07/29/2020-LVEF 60-65%, grade 1 diastolic dysfunction, RV poorly visualized  Assessment:  Acute PE in the setting of COVID-19 infection He did not have significant cor pulmonale or RV dysfunction Appears to be doing well today He will need anticoagulation for at least 6 months  Cleared to return to work and letter given to patient  Post COVID-19, abnormal CT Has recovered to baseline CT noted with right lower lobe consolidation and lymphadenopathy which is likely reactive.  We will get a follow-up CT to reevaluate  Plan/Recommendations: Work letter given Continue anticoagulation CT chest  09/26/2020 MD [facility] 09/17/2020, 10:50 AM  CC: 09/19/2020, NP

## 2020-09-17 NOTE — Patient Instructions (Addendum)
We will give a letter stating that he was hospitalized from 07/28/2020 to 08/01/2020 You were examined in office today and cleared to return to work  Continue anticoagulation Order CT chest without contrast for evaluation of enlarged lymph nodes  Follow-up in 3 months.

## 2020-09-24 ENCOUNTER — Other Ambulatory Visit: Payer: Self-pay

## 2020-09-24 ENCOUNTER — Ambulatory Visit (INDEPENDENT_AMBULATORY_CARE_PROVIDER_SITE_OTHER)
Admission: RE | Admit: 2020-09-24 | Discharge: 2020-09-24 | Disposition: A | Discharge status: Home / Self Care | Payer: Self-pay | Source: Ambulatory Visit | Attending: Pulmonary Disease | Admitting: Pulmonary Disease

## 2020-09-24 DIAGNOSIS — R599 Enlarged lymph nodes, unspecified: Secondary | ICD-10-CM

## 2020-11-05 ENCOUNTER — Other Ambulatory Visit (INDEPENDENT_AMBULATORY_CARE_PROVIDER_SITE_OTHER): Payer: Self-pay | Admitting: Primary Care

## 2020-11-05 NOTE — Telephone Encounter (Signed)
Requested medications are due for refill today.  yes  Requested medications are on the active medications list.  yes  Last refill. 09/08/2020  Future visit scheduled.   no  Notes to clinic.  Chart states no PCP. Only seen once at [facility]. Please advise.

## 2020-11-09 NOTE — Telephone Encounter (Signed)
Patient was to f/u with neurology

## 2020-12-14 ENCOUNTER — Other Ambulatory Visit (INDEPENDENT_AMBULATORY_CARE_PROVIDER_SITE_OTHER): Payer: Self-pay | Admitting: Primary Care

## 2020-12-15 NOTE — Telephone Encounter (Signed)
Requested medication (s) are due for refill today: yes   Requested medication (s) are on the active medication list: yes   Last refill: 11/10/2020  Future visit scheduled: no   Notes to clinic: Patient due for follow up appt    Requested Prescriptions  Pending Prescriptions Disp Refills   ELIQUIS 5 MG TABS tablet [Pharmacy Med Name: ELIQUIS 5MG  TABLETS] 60 tablet 0    Sig: TAKE 1 TABLET(5 MG) BY MOUTH TWICE DAILY      Hematology:  Anticoagulants Failed - 12/14/2020  8:14 PM      Failed - Valid encounter within last 12 months    Recent Outpatient Visits           4 months ago Multiple subsegmental pulmonary emboli without acute cor pulmonale (HCC)   [facility] 12/16/2020 P, NP                Passed - HGB in normal range and within 360 days    Hemoglobin  Date Value Ref Range Status  08/21/2020 13.0 13.0 - 17.7 g/dL Final          Passed - PLT in normal range and within 360 days    Platelets  Date Value Ref Range Status  08/21/2020 269 150 - 450 x10E3/uL Final          Passed - HCT in normal range and within 360 days    Hematocrit  Date Value Ref Range Status  08/21/2020 40.5 37.5 - 51.0 % Final          Passed - Cr in normal range and within 360 days    Creatinine, Ser  Date Value Ref Range Status  08/01/2020 0.86 0.61 - 1.24 mg/dL Final

## 2020-12-16 NOTE — Telephone Encounter (Signed)
Therapy completed per neurology notes

## 2021-01-23 ENCOUNTER — Other Ambulatory Visit: Payer: Self-pay

## 2021-01-23 ENCOUNTER — Ambulatory Visit (INDEPENDENT_AMBULATORY_CARE_PROVIDER_SITE_OTHER): Payer: Self-pay | Admitting: Pulmonary Disease

## 2021-01-23 ENCOUNTER — Encounter: Payer: Self-pay | Admitting: Pulmonary Disease

## 2021-01-23 VITALS — BP 144/80 | HR 82 | Ht 75.0 in | Wt >= 6400 oz

## 2021-01-23 DIAGNOSIS — I2694 Multiple subsegmental pulmonary emboli without acute cor pulmonale: Secondary | ICD-10-CM

## 2021-01-23 LAB — D-DIMER, QUANTITATIVE: D-Dimer, Quant: <0.19 mcg/mL FEU (ref <0.50)

## 2021-01-23 NOTE — Progress Notes (Signed)
         Artez Regis    [id number]    [birth year]  Primary Care Physician:Patient, No Pcp Per (Inactive)  Referring Physician: No referring provider defined for this encounter.  Chief complaint: Consult for post COVID-19, PE  HPI: 35 year old with obesity Admitted with Covid pneumonia from 07/28/2020 to 08/01/2020 Treated with steroids and discharged on room air  Also noted to have pulmonary embolism secondary to COVID-19 infection.  He was anticoagulated and discharged on Eliquis.  CT on admission showed reactive lymphadenopathy which will need follow-up  Post discharge he is doing well, back to baseline, reports no dyspnea, chest pain or leg swelling He is requesting a letter to go back to work  Pets: No pets Occupation: Naval architect Exposures: No mold, hot tub, Jacuzzi, no feather pillows or comforter Smoking history: Never smoker Travel history: Originally from [place].  No significant travel history Relevant family history: No family history of lung disease  Outpatient Encounter Medications as of 01/23/2021  Medication Sig   ELIQUIS 5 MG TABS tablet TAKE 1 TABLET(5 MG) BY MOUTH TWICE DAILY   No facility-administered encounter medications on file as of 01/23/2021.    Physical Exam: Blood pressure (!) 144/80, pulse 82, height 6\' 3"  (1.905 m), weight (!) 414 lb 12.8 oz (188.2 kg), SpO2 100 %. Gen:      No acute distress HEENT:  EOMI, sclera anicteric Neck:     No masses; no thyromegaly Lungs:    Clear to auscultation bilaterally; normal respiratory effort CV:         Regular rate and rhythm; no murmurs Abd:      + bowel sounds; soft, non-tender; no palpable masses, no distension Ext:    No edema; adequate peripheral perfusion Skin:      Warm and dry; no rash Neuro: alert and oriented x 3 Psych: normal mood and affect   Data Reviewed: Imaging: CTA 07/28/2020-bilateral segmental and subsegmental PE, RV/LV ratio is 1, right lower lobe consolidation with hilar and  mediastinal lymph nodes, hepatic steatosis.    CT high-resolution 09/24/2020-minimal bandlike scarring, Bases of the right lung.  No evidence of fibrotic ILD I have reviewed the images personally.  Lower extremity duplex 07/28/2020- no DVT bilaterally Lower extremity duplex 07/29/2020- no DVT on the left  PFTs:   Labs:  Cardiac: Echo 07/29/2020-LVEF 60-65%, grade 1 diastolic dysfunction, RV poorly visualized   Assessment:  Acute PE in the setting of COVID-19 infection He did not have significant cor pulmonale or RV dysfunction Doing well and has returned to work He has completed over 6 months of anticoagulation  We will check a D-dimer and if normal then stop anticoagulation  Post COVID-19 Has recovered to baseline High-res CT reviewed with no significant interstitial lung disease  Plan/Recommendations: Check D-dimer  09/26/2020 MD [facility] 01/23/2021, 9:06 AM  CC: No ref. provider found

## 2021-01-23 NOTE — Addendum Note (Signed)
Addended by: Jacquiline Doe on: 01/23/2021 05:10 PM   Modules accepted: Orders

## 2021-01-23 NOTE — Addendum Note (Signed)
Addended by: Demetrio Lapping E on: 01/23/2021 02:11 PM   Modules accepted: Orders

## 2021-01-23 NOTE — Patient Instructions (Signed)
We will check a lab called D-dimer If normal then we can take you off Eliquis I will be in touch with results  Follow-up as needed

## 2021-09-14 ENCOUNTER — Telehealth: Payer: Self-pay | Admitting: Pulmonary Disease

## 2021-09-14 NOTE — Telephone Encounter (Signed)
Paperwork given to [facility] and encounter routed to her for f/u.  ?

## 2021-09-14 NOTE — Telephone Encounter (Signed)
Paperwork completed and signed by Dr. Isaiah Serge.  Requested information faxed to [facility] [phone].  ?Paper aware and will pick up at front desk 09/15/21. ?

## 2022-03-23 IMAGING — CR DG TIBIA/FIBULA 2V*R*
4 series · 4 of 4 positions shown · non-contrast
Comparison: None.

CLINICAL DATA: posterior tib pain

Right leg pain no known injury.  Swelling and bruising.
EXAM:
RIGHT TIBIA AND FIBULA - 2 VIEW

[tibia ap (1 of 2)]
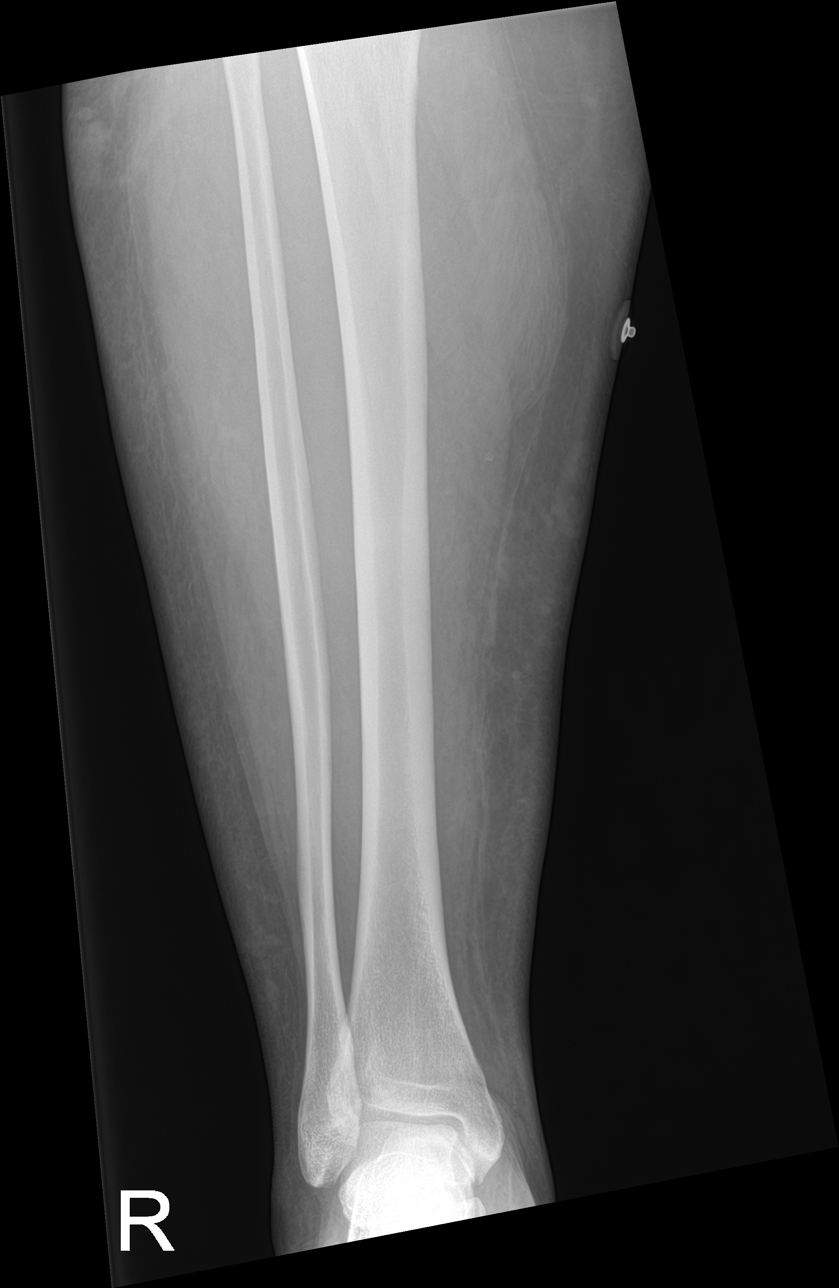

[tibia ap (2 of 2)]
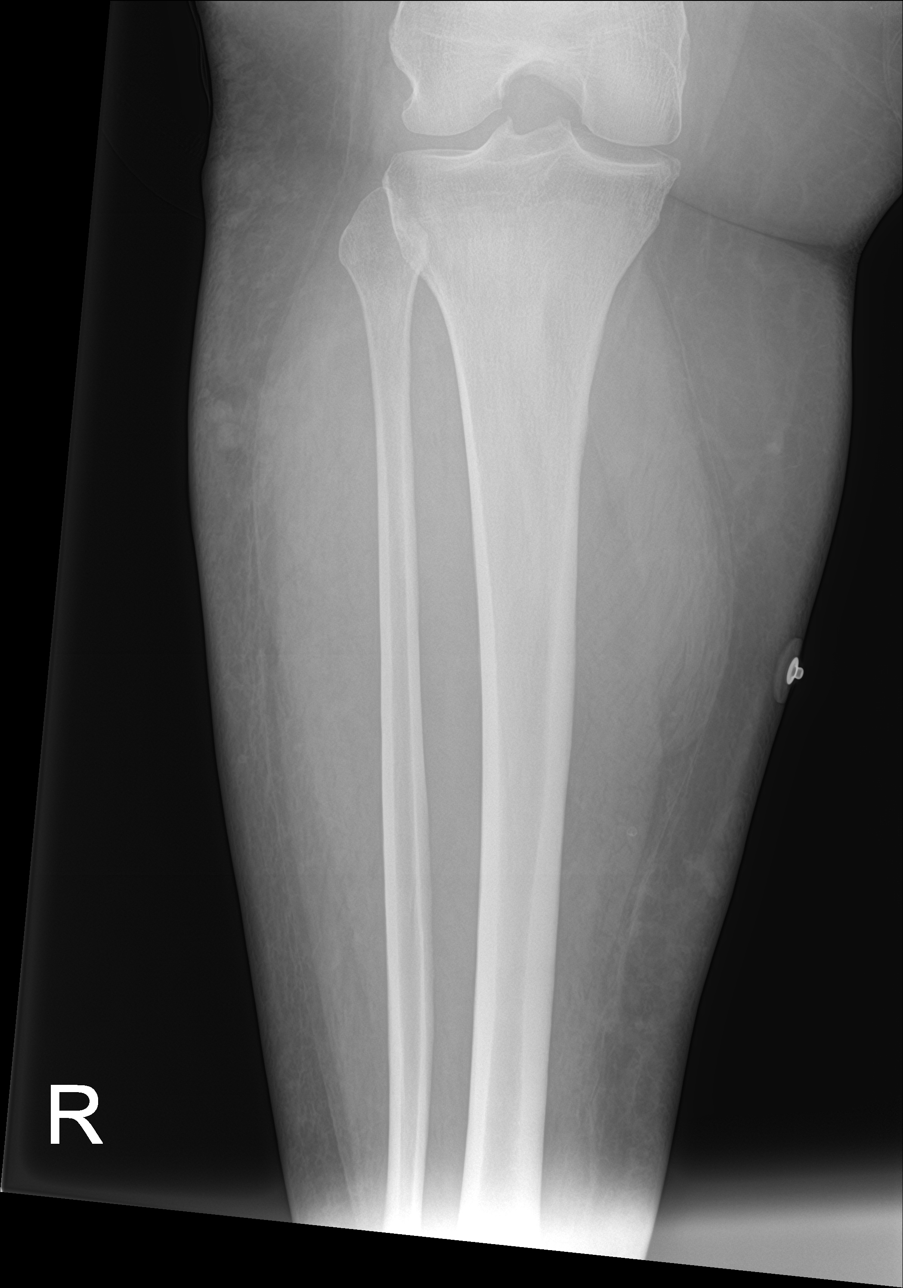

[tibia lat (1 of 2)]
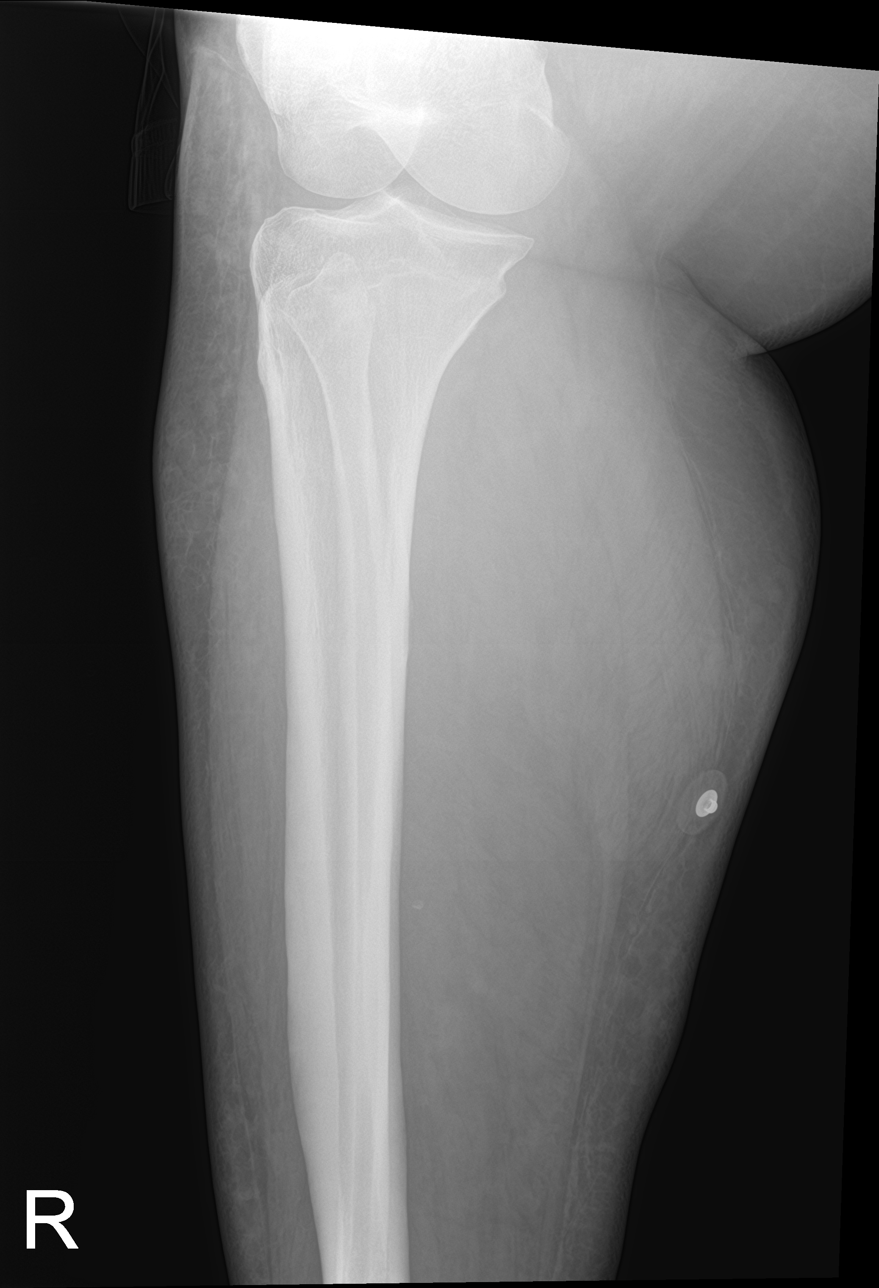

[tibia lat (2 of 2)]
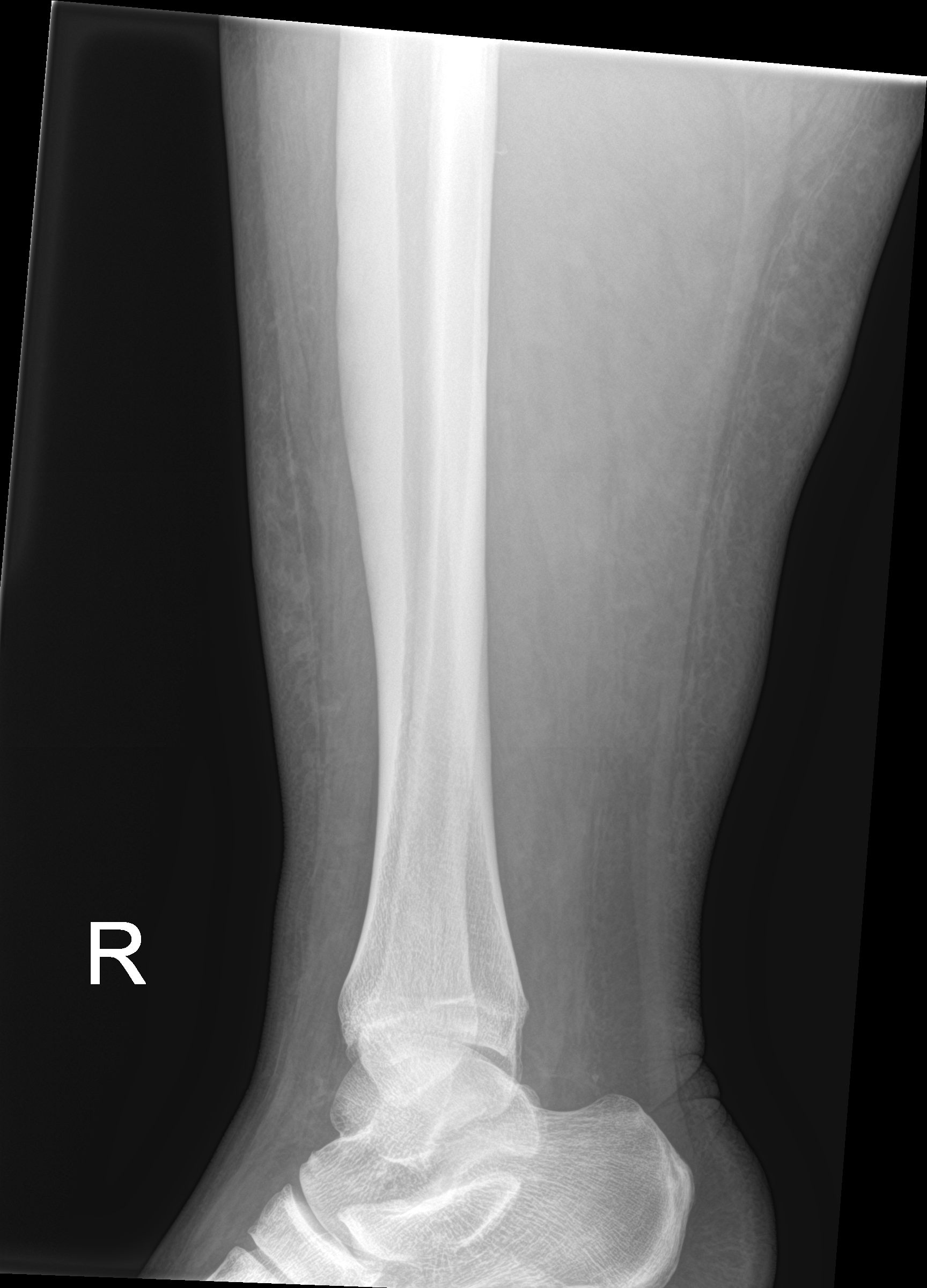

[4 of 4 positions shown; findings below may reference images not displayed]

FINDINGS: The cortical margins of the tibia and fibula are intact. There is no
evidence of fracture or other focal bone lesions. Knee and ankle
alignment are maintained. Diffuse soft tissue edema throughout the
lower leg. No soft tissue air.
IMPRESSION: Diffuse soft tissue edema throughout the lower leg. No osseous
abnormality.

## 2022-03-23 IMAGING — DX DG CHEST 2V
2 series · 2 of 2 positions shown · non-contrast
Comparison: None.

CLINICAL DATA: Shortness of breath

EXAM:
CHEST - 2 VIEW

[w chest pa]
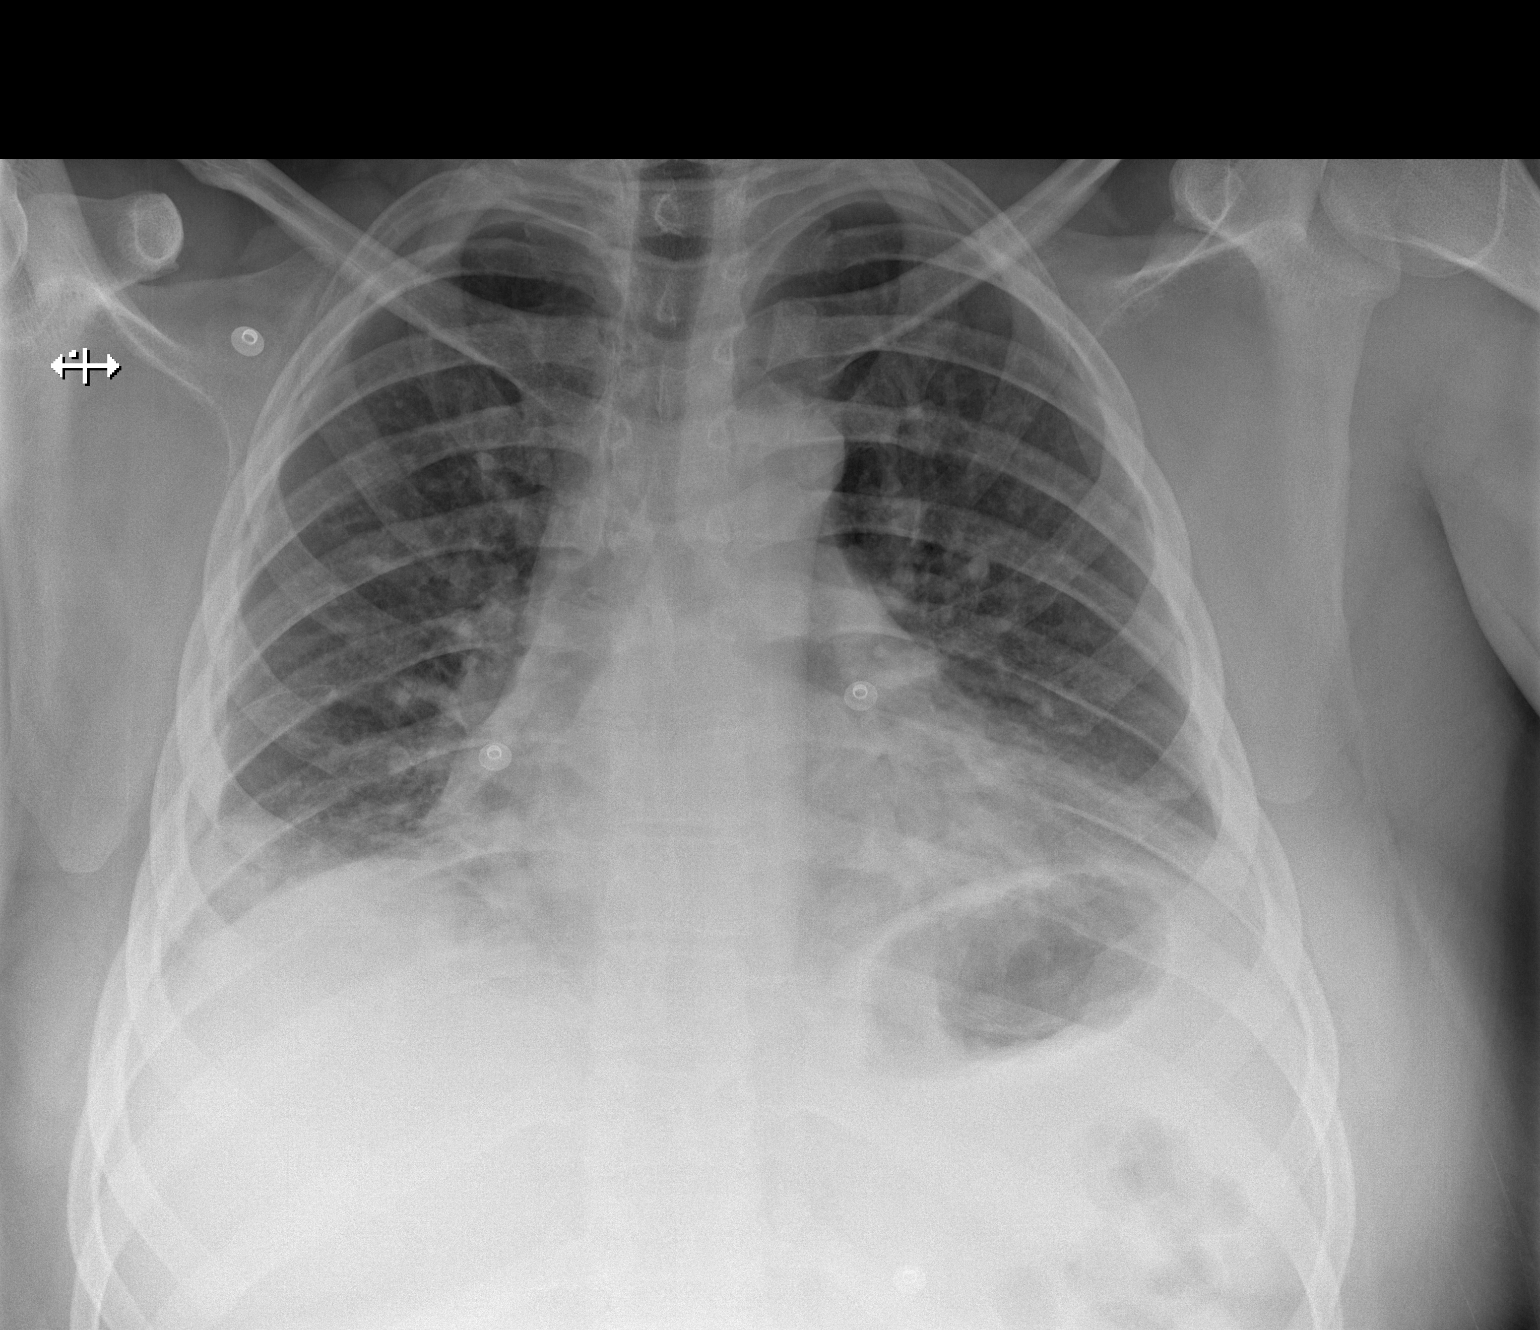

[w chest lat]
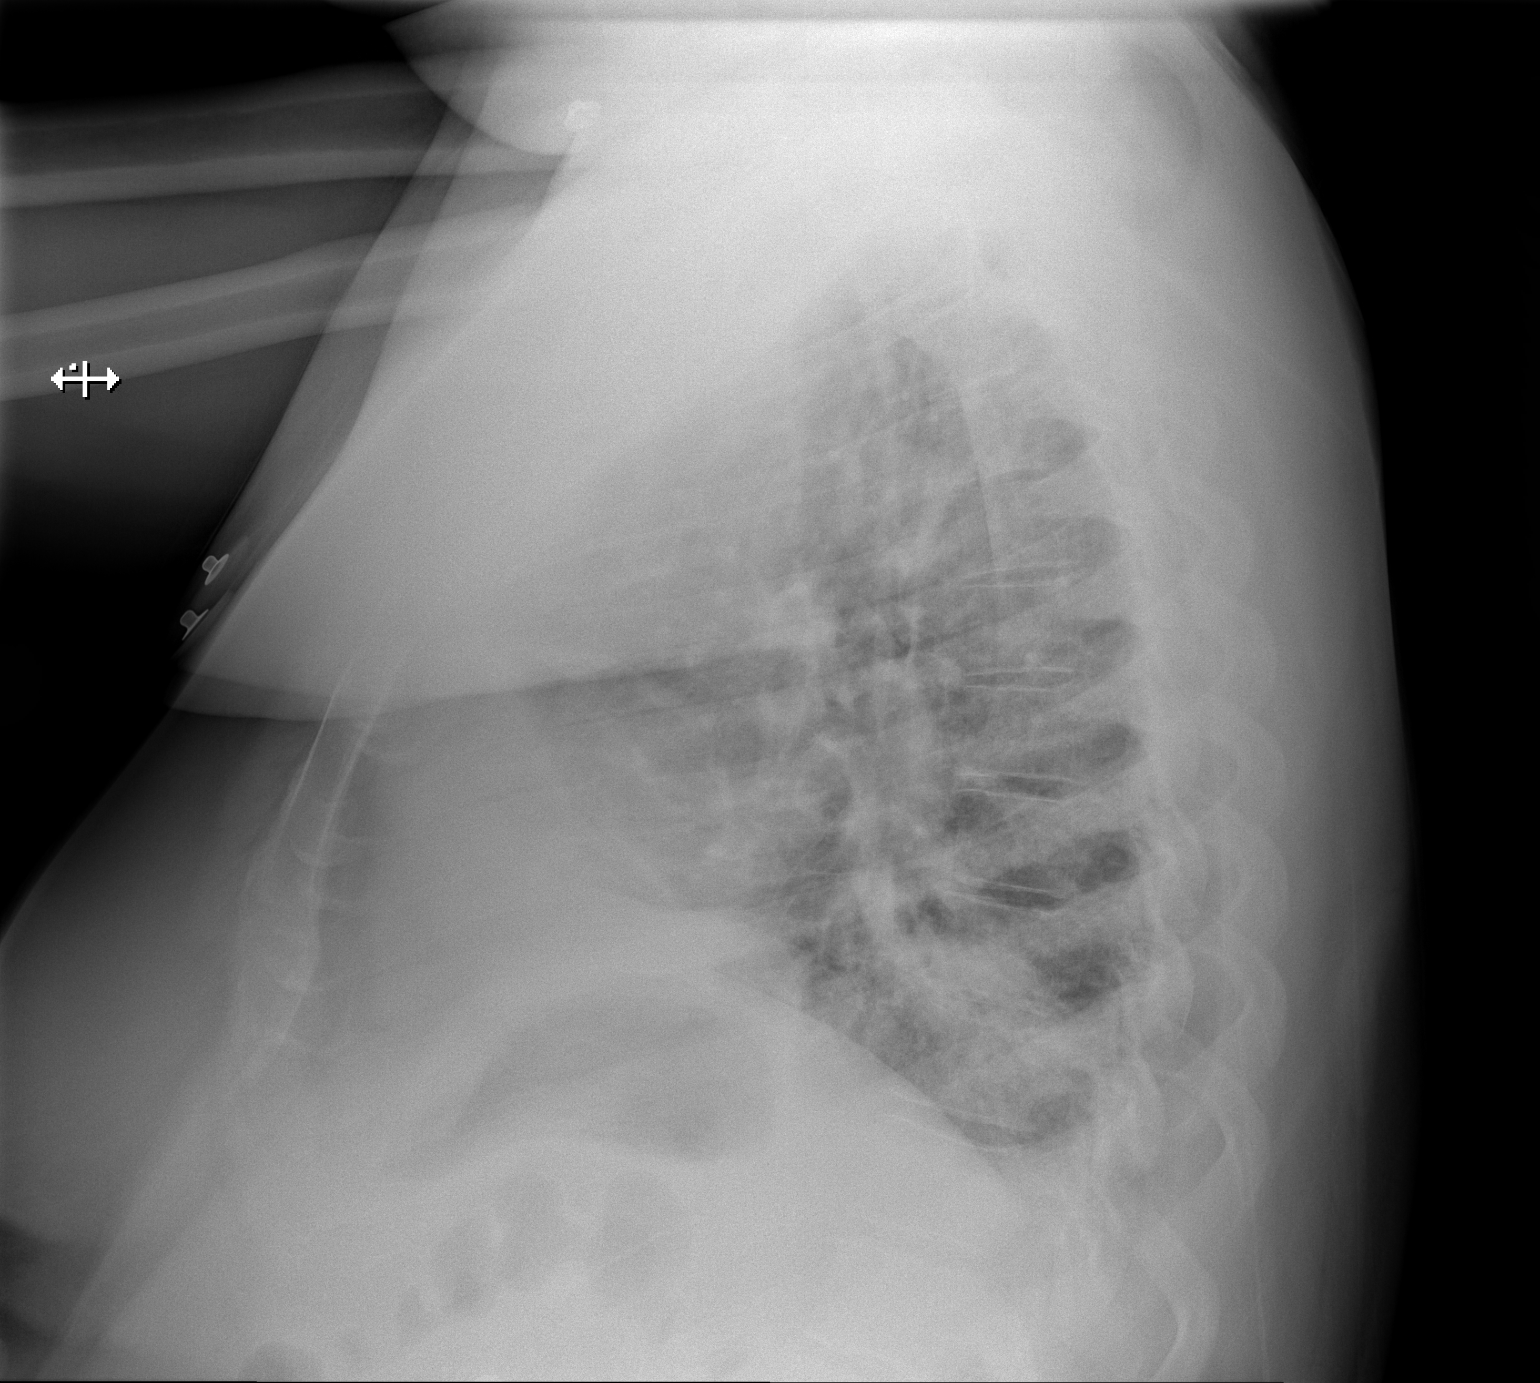

[2 of 2 positions shown; findings below may reference images not displayed]

FINDINGS: Low lung volumes. Mild patchy density at the lung bases. No
significant pleural effusion. No pneumothorax. Cardiomediastinal
contours are within normal limits.
IMPRESSION: Mild patchy atelectasis/consolidation at the lung bases.
